# Patient Record
Sex: Male | Born: 1965 | Race: White | Hispanic: No | Smoking: Never smoker
Health system: Southern US, Community
[De-identification: ages and names within clinical notes are randomized; demographics above are authoritative.]

## PROBLEM LIST (undated history)

## (undated) DIAGNOSIS — I10 Essential (primary) hypertension: Secondary | ICD-10-CM

## (undated) DIAGNOSIS — K759 Inflammatory liver disease, unspecified: Secondary | ICD-10-CM

## (undated) DIAGNOSIS — E119 Type 2 diabetes mellitus without complications: Secondary | ICD-10-CM

## (undated) HISTORY — PX: CARDIAC CATHETERIZATION: SHX172

## (undated) HISTORY — PX: LEG AMPUTATION: SHX1105

## (undated) HISTORY — PX: FOOT SURGERY: SHX648

## (undated) HISTORY — PX: BACK SURGERY: SHX140

---

## 1998-05-27 ENCOUNTER — Inpatient Hospital Stay (HOSPITAL_COMMUNITY): Admission: EM | Admit: 1998-05-27 | Discharge: 1998-06-01 | Payer: Self-pay | Admitting: Emergency Medicine

## 1999-04-13 ENCOUNTER — Encounter: Payer: Self-pay | Admitting: Emergency Medicine

## 1999-04-13 ENCOUNTER — Emergency Department (HOSPITAL_COMMUNITY): Admission: EM | Admit: 1999-04-13 | Discharge: 1999-04-13 | Payer: Self-pay | Admitting: Emergency Medicine

## 1999-06-12 ENCOUNTER — Inpatient Hospital Stay (HOSPITAL_COMMUNITY): Admission: EM | Admit: 1999-06-12 | Discharge: 1999-06-14 | Payer: Self-pay | Admitting: Emergency Medicine

## 1999-06-12 ENCOUNTER — Encounter: Payer: Self-pay | Admitting: Internal Medicine

## 1999-07-17 ENCOUNTER — Other Ambulatory Visit (HOSPITAL_COMMUNITY): Admission: RE | Admit: 1999-07-17 | Discharge: 1999-07-21 | Payer: Self-pay | Admitting: Psychiatry

## 2000-11-25 ENCOUNTER — Inpatient Hospital Stay (HOSPITAL_COMMUNITY): Admission: EM | Admit: 2000-11-25 | Discharge: 2000-11-27 | Payer: Self-pay | Admitting: Emergency Medicine

## 2000-12-07 ENCOUNTER — Encounter: Admission: RE | Admit: 2000-12-07 | Discharge: 2000-12-07 | Payer: Self-pay

## 2001-03-18 ENCOUNTER — Emergency Department (HOSPITAL_COMMUNITY): Admission: EM | Admit: 2001-03-18 | Discharge: 2001-03-18 | Payer: Self-pay | Admitting: Emergency Medicine

## 2001-03-18 ENCOUNTER — Encounter: Payer: Self-pay | Admitting: Emergency Medicine

## 2001-03-30 ENCOUNTER — Inpatient Hospital Stay (HOSPITAL_COMMUNITY): Admission: AD | Admit: 2001-03-30 | Discharge: 2001-04-09 | Payer: Self-pay | Admitting: Orthopaedic Surgery

## 2002-08-04 ENCOUNTER — Emergency Department (HOSPITAL_COMMUNITY): Admission: EM | Admit: 2002-08-04 | Discharge: 2002-08-04 | Payer: Self-pay | Admitting: Emergency Medicine

## 2004-01-09 ENCOUNTER — Emergency Department (HOSPITAL_COMMUNITY): Admission: EM | Admit: 2004-01-09 | Discharge: 2004-01-09 | Payer: Self-pay | Admitting: Emergency Medicine

## 2004-05-12 ENCOUNTER — Inpatient Hospital Stay (HOSPITAL_COMMUNITY): Admission: RE | Admit: 2004-05-12 | Discharge: 2004-05-16 | Payer: Self-pay | Admitting: Orthopaedic Surgery

## 2004-06-19 ENCOUNTER — Inpatient Hospital Stay (HOSPITAL_COMMUNITY): Admission: AD | Admit: 2004-06-19 | Discharge: 2004-07-03 | Payer: Self-pay | Admitting: Orthopedic Surgery

## 2004-06-19 ENCOUNTER — Encounter (INDEPENDENT_AMBULATORY_CARE_PROVIDER_SITE_OTHER): Payer: Self-pay | Admitting: Specialist

## 2004-06-23 ENCOUNTER — Encounter (INDEPENDENT_AMBULATORY_CARE_PROVIDER_SITE_OTHER): Payer: Self-pay | Admitting: Cardiology

## 2004-07-03 ENCOUNTER — Encounter (HOSPITAL_BASED_OUTPATIENT_CLINIC_OR_DEPARTMENT_OTHER): Admission: RE | Admit: 2004-07-03 | Discharge: 2004-07-18 | Payer: Self-pay | Admitting: Orthopedic Surgery

## 2004-07-03 ENCOUNTER — Encounter (HOSPITAL_COMMUNITY): Admission: RE | Admit: 2004-07-03 | Discharge: 2004-08-29 | Payer: Self-pay | Admitting: Physician Assistant

## 2004-07-13 ENCOUNTER — Emergency Department (HOSPITAL_COMMUNITY): Admission: EM | Admit: 2004-07-13 | Discharge: 2004-07-13 | Payer: Self-pay | Admitting: *Deleted

## 2004-07-17 ENCOUNTER — Ambulatory Visit (HOSPITAL_COMMUNITY): Admission: RE | Admit: 2004-07-17 | Discharge: 2004-07-17 | Payer: Self-pay | Admitting: Orthopedic Surgery

## 2004-07-21 ENCOUNTER — Emergency Department (HOSPITAL_COMMUNITY): Admission: EM | Admit: 2004-07-21 | Discharge: 2004-07-22 | Payer: Self-pay | Admitting: Emergency Medicine

## 2004-07-23 ENCOUNTER — Inpatient Hospital Stay (HOSPITAL_COMMUNITY): Admission: AD | Admit: 2004-07-23 | Discharge: 2004-07-25 | Payer: Self-pay | Admitting: Internal Medicine

## 2004-07-23 ENCOUNTER — Ambulatory Visit: Payer: Self-pay | Admitting: Infectious Diseases

## 2004-10-13 ENCOUNTER — Ambulatory Visit: Payer: Self-pay | Admitting: Physical Medicine & Rehabilitation

## 2004-10-13 ENCOUNTER — Inpatient Hospital Stay (HOSPITAL_COMMUNITY): Admission: AD | Admit: 2004-10-13 | Discharge: 2004-10-28 | Payer: Self-pay | Admitting: Internal Medicine

## 2004-10-21 ENCOUNTER — Encounter (INDEPENDENT_AMBULATORY_CARE_PROVIDER_SITE_OTHER): Payer: Self-pay | Admitting: *Deleted

## 2005-01-17 IMAGING — US IR US GUIDE VASC ACCESS LEFT
1 series · 3 of 3 positions shown · non-contrast
Comparison: none

CLINICAL DATA: Foot infection requiring IV antibiotics.  Removal of a right upper extremity PICC line due to infection.  Request to place new PICC line within left upper extremity. 
 LEFT UPPER EXTREMITY PICC PLACEMENT WITH ULTRASOUND AND FLUORO GUIDANCE
TECHNIQUE: The left arm was prepped with Betadine, draped in the usual sterile fashion, and infiltrated locally with 1% Lidocaine.  Ultrasound demonstrated patency of the left brachial vein.  Under real-time ultrasound guidance, this vein was accessed with a 21 gauge micropuncture needle.  Ultrasound image documentation was performed.  The needle was exchanged over a guidewire for a Peel-Away sheath through which a 5 French single-lumen PICC catheter trimmed to 46.5 cm was advanced, positioned with its tip at the distal SVC/right atrial junction.  Fluoroscopy during the procedure and fluoro spot radiograph confirms appropriate catheter position.  The catheter was flushed, secured to the skin with Prolene sutures, and covered with a sterile dressing.  No immediate complication. 
 Fluoro time:  0.2 minutes.
 IMPRESSION
 Technically successful left arm PICC placement with ultrasound and fluoroscopic guidance.  Ready for routine use.

[Series 1: sp us guide vasc access*right* · 3 of 3 slices shown]
[im 1/3]
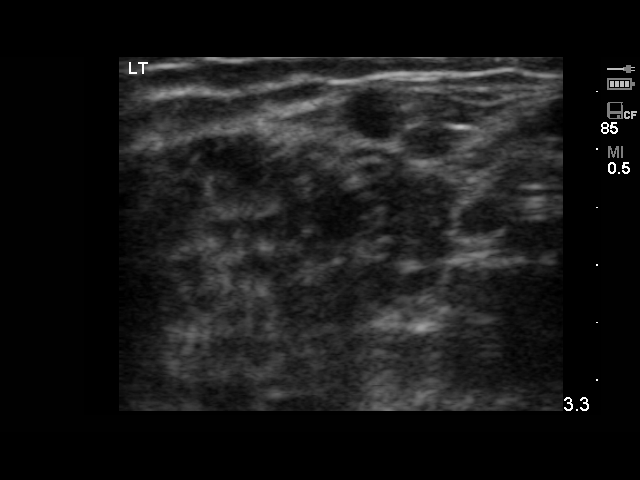
[im 2/3]
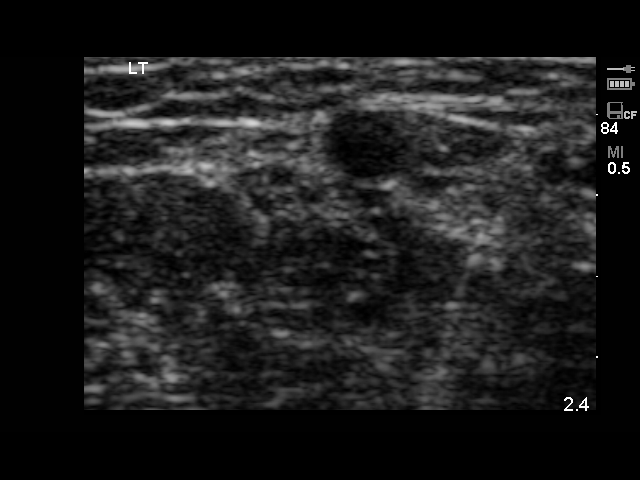
[im 3/3]
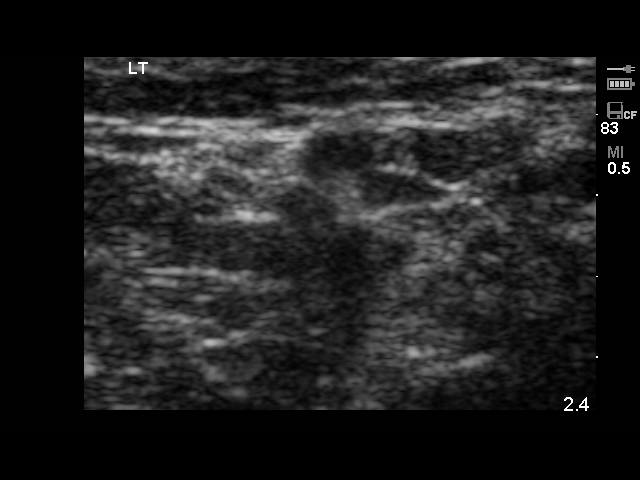

[3 of 3 positions shown; findings below may reference images not displayed]

## 2005-01-24 IMAGING — CT CT CHEST W/ CM
1 series · 15 of 33 positions shown, 19 images · IV contrast ([ID] OMNI 300)
Comparison: none

CLINICAL DATA: MRSA. Septic pulmonary nodules.  Osteomyelitis.  Diabetes. 
CT OF THE CHEST WITH CONTRAST 07/24/04 
Comparing conventional radiograph of 06/30/04 and prior CT scan of the chest 06/23/04.
TECHNIQUE: Contiguous axial CT images were obtained from the lung apices to the bases following intravenous administration of 100 cc of Omnipaque 300 IV contrast.

[Series 2: routine chest · axial · 0.70mm/px · z∈[-339,-74]mm · 15 of 63 slices shown, 19 images]
[im 5/63  mediastinal]
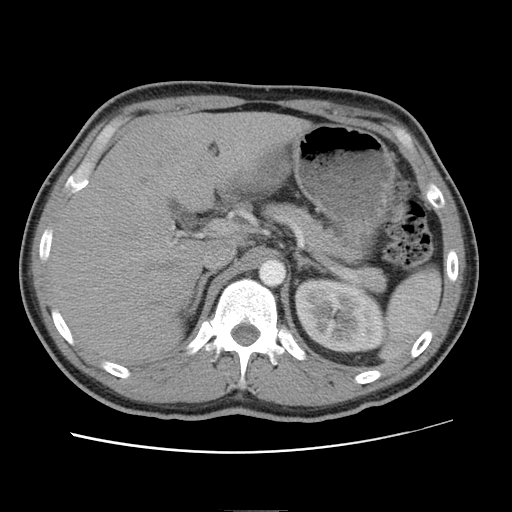
[im 5/63  lung]
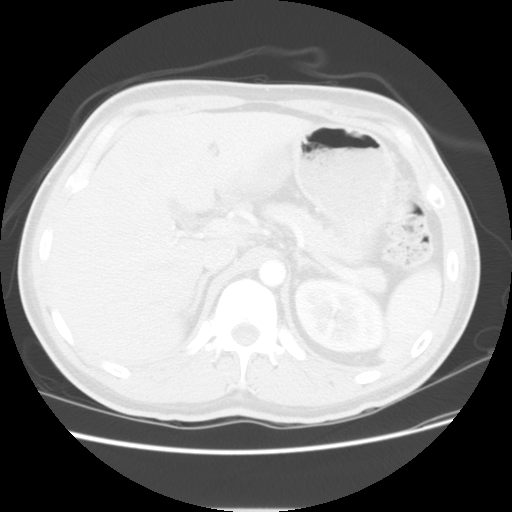
[im 10/63  lung]
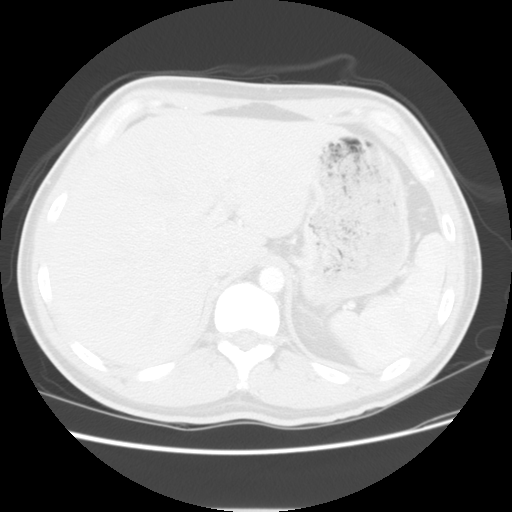
[im 13/63  lung]
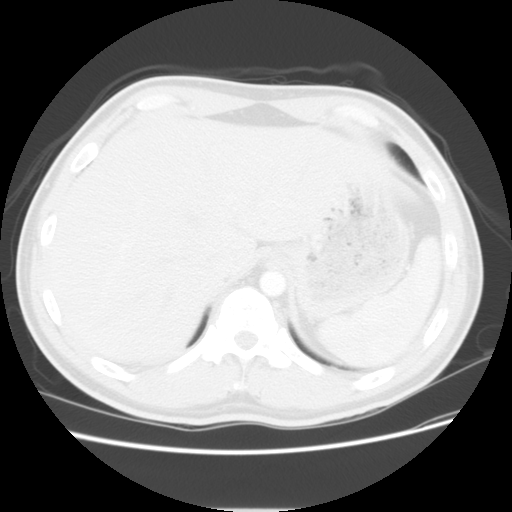
[im 17/63  lung]
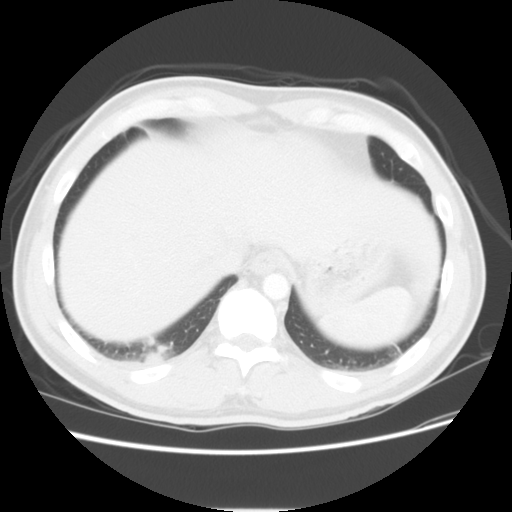
[im 21/63  mediastinal]
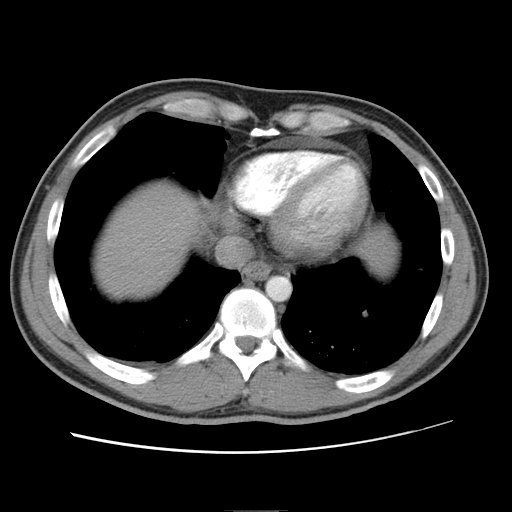
[im 21/63  lung]
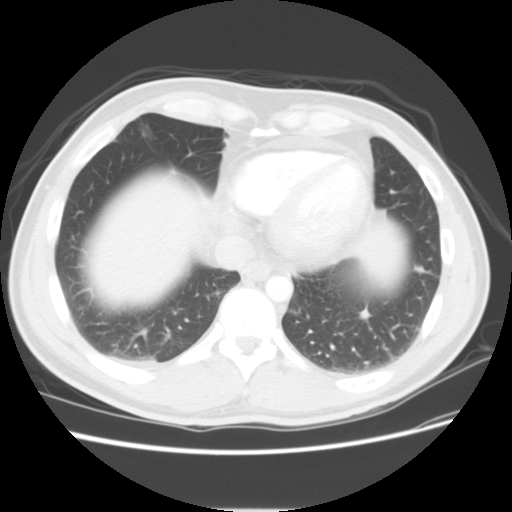
[im 25/63  lung]
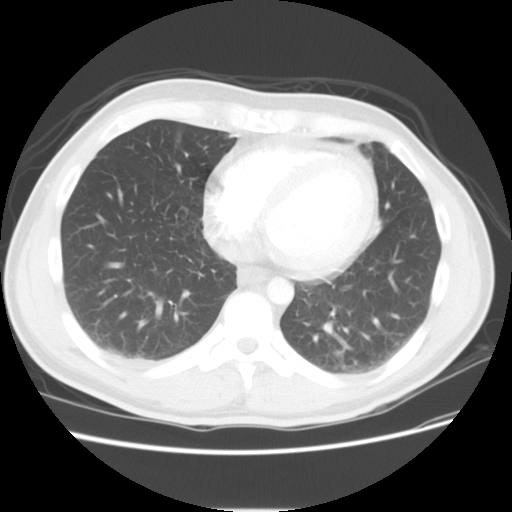
[im 28/63  lung]
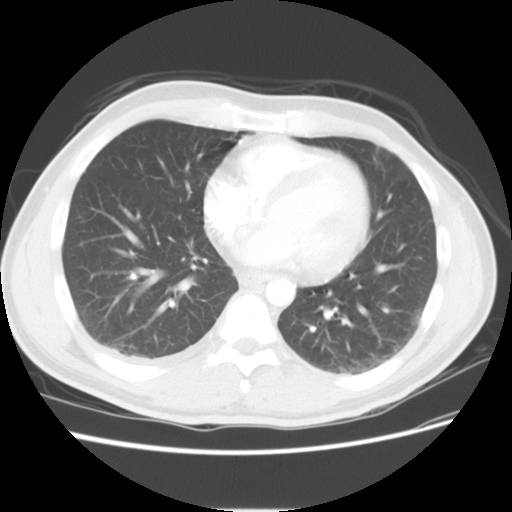
[im 33/63  lung]
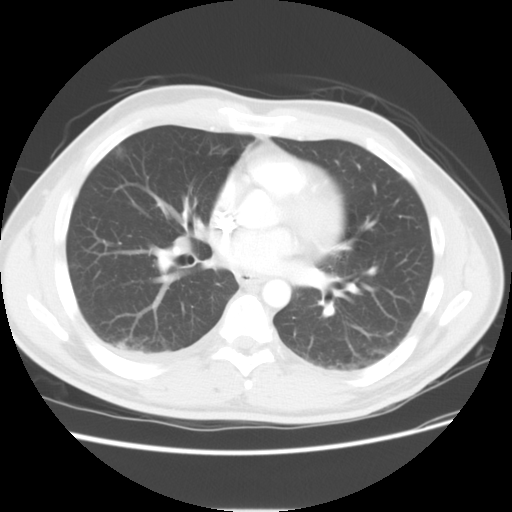
[im 35/63  mediastinal]
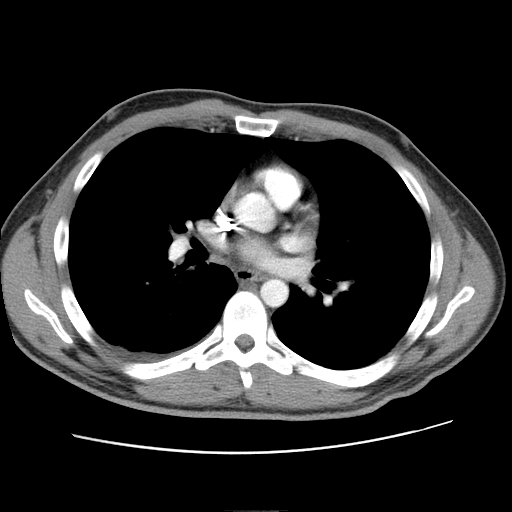
[im 35/63  lung]
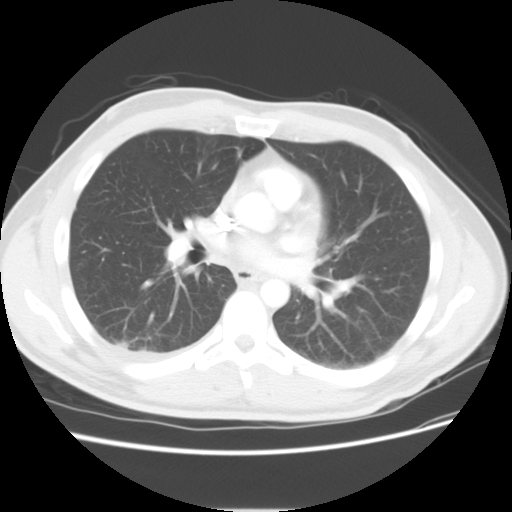
[im 38/63  lung]
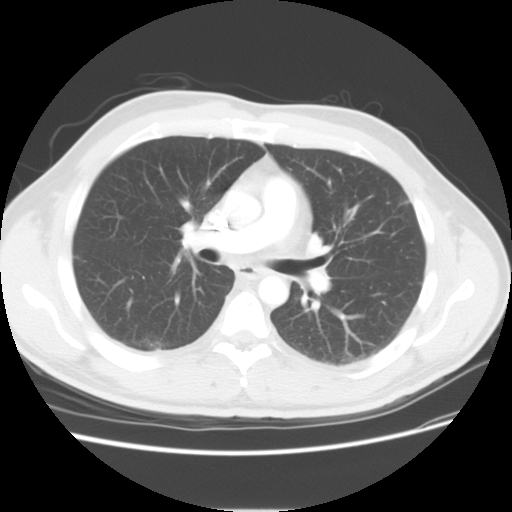
[im 42/63  lung]
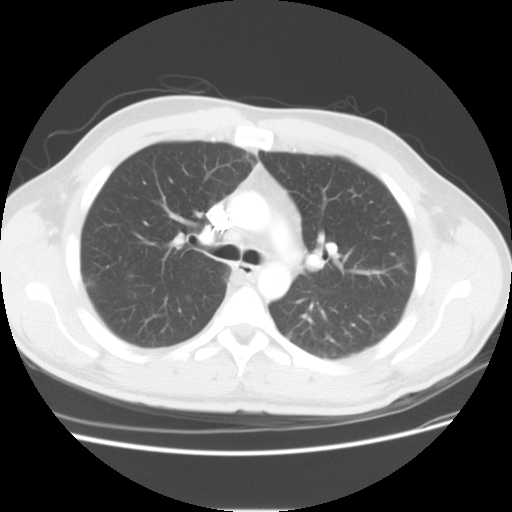
[im 46/63  lung]
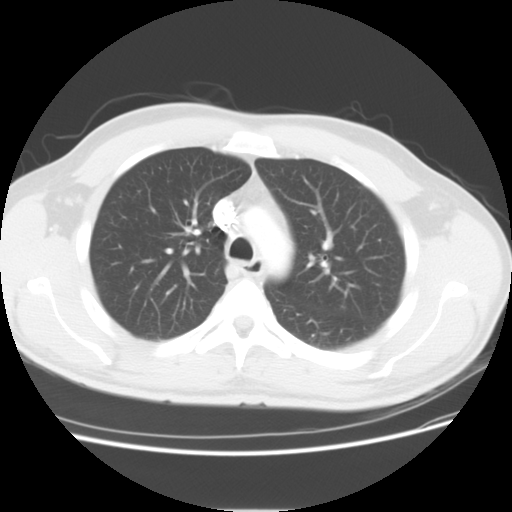
[im 50/63  mediastinal]
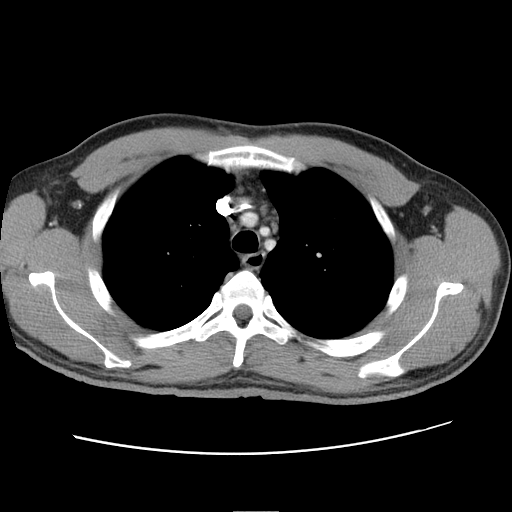
[im 50/63  lung]
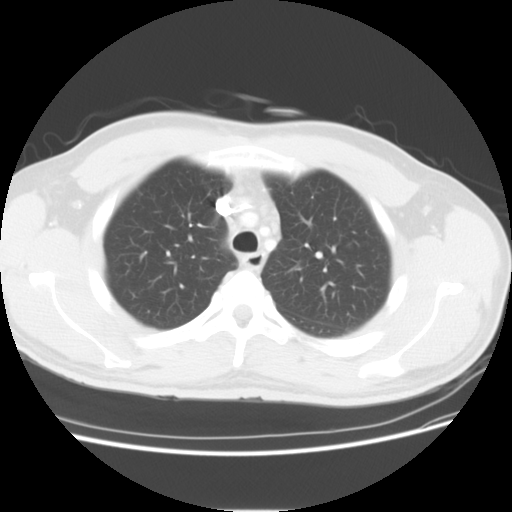
[im 53/63  lung]
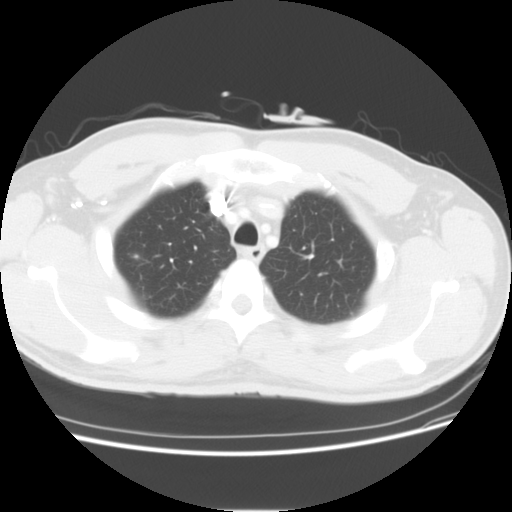
[im 58/63  lung]
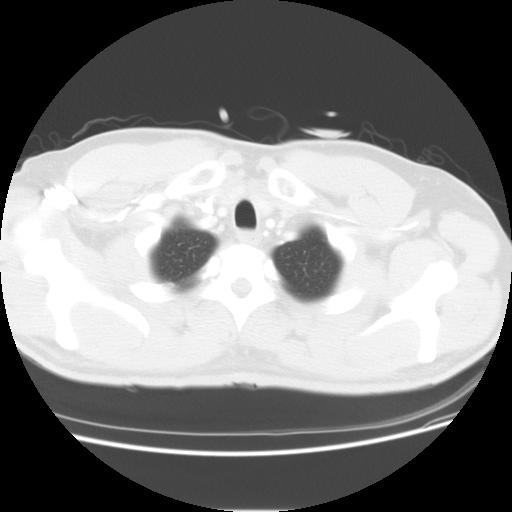

[15 of 33 positions shown; findings below may reference images not displayed]

FINDINGS: There is reduction in size, number and conspicuity of bilateral ill-defined pulmonary nodules.  An index nodule anteriorly in the right upper lobe which previously measured 14 mm in AP dimension currently measures 10 mm in AP dimension and is considerably less dense.  Some other regions of significant nodularity involvement have completely resolved.  Another index nodule at the left lung base which previously measured 16 mm in diameter currently measures 7 mm in diameter.  Approximately 15 small regions of nodular opacity persist.  We do not demonstrate cavitation.  
  There is improvement in the bibasilar atelectasis noted on the prior exam as well as near resolution of the pleural effusions, with only a very small amount of right pleural fluid persisting.  Some small mediastinal lymph nodes are still present.  There is a small anterior pericardial effusion.  
IMPRESSION
1.  Significant improvement in bilateral irregular pulmonary nodules.  Many of these nodules have resolved and all of them appear smaller.  Appearance suggest significant improvement in septic pulmonary emboli.  
2.  Small pericardial effusion.

## 2005-03-08 ENCOUNTER — Inpatient Hospital Stay (HOSPITAL_COMMUNITY): Admission: EM | Admit: 2005-03-08 | Discharge: 2005-03-12 | Payer: Self-pay | Admitting: Emergency Medicine

## 2005-04-06 ENCOUNTER — Encounter: Admission: RE | Admit: 2005-04-06 | Discharge: 2005-06-04 | Payer: Self-pay | Admitting: Orthopaedic Surgery

## 2005-05-05 ENCOUNTER — Inpatient Hospital Stay (HOSPITAL_COMMUNITY): Admission: EM | Admit: 2005-05-05 | Discharge: 2005-05-09 | Payer: Self-pay | Admitting: *Deleted

## 2005-05-05 ENCOUNTER — Ambulatory Visit: Payer: Self-pay | Admitting: Internal Medicine

## 2005-05-13 ENCOUNTER — Ambulatory Visit: Payer: Self-pay | Admitting: Internal Medicine

## 2005-07-03 ENCOUNTER — Inpatient Hospital Stay (HOSPITAL_COMMUNITY): Admission: EM | Admit: 2005-07-03 | Discharge: 2005-07-06 | Payer: Self-pay | Admitting: Emergency Medicine

## 2009-05-27 ENCOUNTER — Encounter: Admission: RE | Admit: 2009-05-27 | Discharge: 2009-05-27 | Payer: Self-pay | Admitting: Interventional Cardiology

## 2009-06-20 ENCOUNTER — Inpatient Hospital Stay (HOSPITAL_BASED_OUTPATIENT_CLINIC_OR_DEPARTMENT_OTHER): Admission: RE | Admit: 2009-06-20 | Discharge: 2009-06-20 | Payer: Self-pay | Admitting: Interventional Cardiology

## 2010-07-12 ENCOUNTER — Observation Stay (HOSPITAL_COMMUNITY): Admission: EM | Admit: 2010-07-12 | Discharge: 2010-07-13 | Payer: Self-pay | Admitting: Emergency Medicine

## 2010-07-13 ENCOUNTER — Inpatient Hospital Stay (HOSPITAL_COMMUNITY): Admission: EM | Admit: 2010-07-13 | Discharge: 2010-07-16 | Payer: Self-pay | Admitting: Emergency Medicine

## 2011-02-12 LAB — COMPREHENSIVE METABOLIC PANEL
ALT: 18 U/L (ref 0–53)
ALT: 20 U/L (ref 0–53)
AST: 23 U/L (ref 0–37)
Albumin: 2.8 g/dL — ABNORMAL LOW (ref 3.5–5.2)
Albumin: 3.1 g/dL — ABNORMAL LOW (ref 3.5–5.2)
Alkaline Phosphatase: 103 U/L (ref 39–117)
CO2: 27 mEq/L (ref 19–32)
Calcium: 8 mg/dL — ABNORMAL LOW (ref 8.4–10.5)
Chloride: 106 mEq/L (ref 96–112)
GFR calc Af Amer: >60 mL/min (ref >60)
GFR calc Af Amer: >60 mL/min (ref >60)
GFR calc non Af Amer: >60 mL/min (ref >60)
Glucose, Bld: 224 mg/dL — ABNORMAL HIGH (ref 70–99)
Potassium: 3.3 mEq/L — ABNORMAL LOW (ref 3.5–5.1)
Sodium: 140 mEq/L (ref 135–145)
Sodium: 142 mEq/L (ref 135–145)
Total Bilirubin: 0.7 mg/dL (ref 0.3–1.2)
Total Protein: 5.5 g/dL — ABNORMAL LOW (ref 6.0–8.3)

## 2011-02-12 LAB — BASIC METABOLIC PANEL
BUN: 10 mg/dL (ref 6–23)
BUN: 11 mg/dL (ref 6–23)
BUN: 2 mg/dL — ABNORMAL LOW (ref 6–23)
BUN: 2 mg/dL — ABNORMAL LOW (ref 6–23)
BUN: 3 mg/dL — ABNORMAL LOW (ref 6–23)
BUN: 4 mg/dL — ABNORMAL LOW (ref 6–23)
CO2: 18 mEq/L — ABNORMAL LOW (ref 19–32)
CO2: 19 mEq/L (ref 19–32)
CO2: 23 mEq/L (ref 19–32)
CO2: 24 mEq/L (ref 19–32)
CO2: 25 mEq/L (ref 19–32)
CO2: 29 mEq/L (ref 19–32)
Calcium: 7.6 mg/dL — ABNORMAL LOW (ref 8.4–10.5)
Calcium: 7.9 mg/dL — ABNORMAL LOW (ref 8.4–10.5)
Calcium: 8 mg/dL — ABNORMAL LOW (ref 8.4–10.5)
Calcium: 8.1 mg/dL — ABNORMAL LOW (ref 8.4–10.5)
Calcium: 8.2 mg/dL — ABNORMAL LOW (ref 8.4–10.5)
Chloride: 102 mEq/L (ref 96–112)
Chloride: 104 mEq/L (ref 96–112)
Chloride: 107 mEq/L (ref 96–112)
Chloride: 108 mEq/L (ref 96–112)
Chloride: 112 mEq/L (ref 96–112)
Chloride: 114 mEq/L — ABNORMAL HIGH (ref 96–112)
Chloride: 114 mEq/L — ABNORMAL HIGH (ref 96–112)
Chloride: 116 mEq/L — ABNORMAL HIGH (ref 96–112)
Creatinine, Ser: 0.66 mg/dL (ref 0.4–1.5)
Creatinine, Ser: 0.72 mg/dL (ref 0.4–1.5)
Creatinine, Ser: 0.79 mg/dL (ref 0.4–1.5)
Creatinine, Ser: 0.82 mg/dL (ref 0.4–1.5)
Creatinine, Ser: 0.84 mg/dL (ref 0.4–1.5)
Creatinine, Ser: 0.97 mg/dL (ref 0.4–1.5)
Creatinine, Ser: 1.25 mg/dL (ref 0.4–1.5)
Creatinine, Ser: 1.47 mg/dL (ref 0.4–1.5)
GFR calc Af Amer: >60 mL/min (ref >60)
GFR calc Af Amer: >60 mL/min (ref >60)
GFR calc Af Amer: >60 mL/min (ref >60)
GFR calc Af Amer: >60 mL/min (ref >60)
GFR calc Af Amer: >60 mL/min (ref >60)
GFR calc Af Amer: >60 mL/min (ref >60)
GFR calc non Af Amer: 51 mL/min — ABNORMAL LOW (ref >60)
GFR calc non Af Amer: 53 mL/min — ABNORMAL LOW (ref >60)
GFR calc non Af Amer: >60 mL/min (ref >60)
GFR calc non Af Amer: >60 mL/min (ref >60)
GFR calc non Af Amer: >60 mL/min (ref >60)
GFR calc non Af Amer: >60 mL/min (ref >60)
Glucose, Bld: 113 mg/dL — ABNORMAL HIGH (ref 70–99)
Glucose, Bld: 128 mg/dL — ABNORMAL HIGH (ref 70–99)
Glucose, Bld: 137 mg/dL — ABNORMAL HIGH (ref 70–99)
Glucose, Bld: 149 mg/dL — ABNORMAL HIGH (ref 70–99)
Glucose, Bld: 209 mg/dL — ABNORMAL HIGH (ref 70–99)
Glucose, Bld: 231 mg/dL — ABNORMAL HIGH (ref 70–99)
Glucose, Bld: 246 mg/dL — ABNORMAL HIGH (ref 70–99)
Potassium: 2.7 mEq/L — CL (ref 3.5–5.1)
Potassium: 3.1 mEq/L — ABNORMAL LOW (ref 3.5–5.1)
Potassium: 3.3 mEq/L — ABNORMAL LOW (ref 3.5–5.1)
Potassium: 3.3 mEq/L — ABNORMAL LOW (ref 3.5–5.1)
Potassium: 3.4 mEq/L — ABNORMAL LOW (ref 3.5–5.1)
Sodium: 138 mEq/L (ref 135–145)
Sodium: 139 mEq/L (ref 135–145)
Sodium: 142 mEq/L (ref 135–145)
Sodium: 142 mEq/L (ref 135–145)
Sodium: 142 mEq/L (ref 135–145)
Sodium: 146 mEq/L — ABNORMAL HIGH (ref 135–145)

## 2011-02-12 LAB — CBC
HCT: 34.2 % — ABNORMAL LOW (ref 39.0–52.0)
HCT: 35.6 % — ABNORMAL LOW (ref 39.0–52.0)
Hemoglobin: 12.7 g/dL — ABNORMAL LOW (ref 13.0–17.0)
MCHC: 35.6 g/dL (ref 30.0–36.0)
MCV: 97.2 fL (ref 78.0–100.0)
Platelets: 180 10*3/uL (ref 150–400)
Platelets: 181 10*3/uL (ref 150–400)
RBC: 3.78 MIL/uL — ABNORMAL LOW (ref 4.22–5.81)
RDW: 12.4 % (ref 11.5–15.5)
RDW: 12.7 % (ref 11.5–15.5)
WBC: 4 10*3/uL (ref 4.0–10.5)
WBC: 6.6 10*3/uL (ref 4.0–10.5)

## 2011-02-12 LAB — GLUCOSE, CAPILLARY
Glucose-Capillary: 113 mg/dL — ABNORMAL HIGH (ref 70–99)
Glucose-Capillary: 118 mg/dL — ABNORMAL HIGH (ref 70–99)
Glucose-Capillary: 119 mg/dL — ABNORMAL HIGH (ref 70–99)
Glucose-Capillary: 121 mg/dL — ABNORMAL HIGH (ref 70–99)
Glucose-Capillary: 132 mg/dL — ABNORMAL HIGH (ref 70–99)
Glucose-Capillary: 140 mg/dL — ABNORMAL HIGH (ref 70–99)
Glucose-Capillary: 148 mg/dL — ABNORMAL HIGH (ref 70–99)
Glucose-Capillary: 149 mg/dL — ABNORMAL HIGH (ref 70–99)
Glucose-Capillary: 161 mg/dL — ABNORMAL HIGH (ref 70–99)
Glucose-Capillary: 188 mg/dL — ABNORMAL HIGH (ref 70–99)
Glucose-Capillary: 194 mg/dL — ABNORMAL HIGH (ref 70–99)
Glucose-Capillary: 212 mg/dL — ABNORMAL HIGH (ref 70–99)
Glucose-Capillary: 223 mg/dL — ABNORMAL HIGH (ref 70–99)
Glucose-Capillary: 247 mg/dL — ABNORMAL HIGH (ref 70–99)

## 2011-02-12 LAB — POTASSIUM: Potassium: 2.9 mEq/L — ABNORMAL LOW (ref 3.5–5.1)

## 2011-02-12 LAB — HEPATIC FUNCTION PANEL
Albumin: 2.9 g/dL — ABNORMAL LOW (ref 3.5–5.2)
Alkaline Phosphatase: 115 U/L (ref 39–117)
Total Bilirubin: 0.8 mg/dL (ref 0.3–1.2)
Total Protein: 5.7 g/dL — ABNORMAL LOW (ref 6.0–8.3)

## 2011-02-13 LAB — COMPREHENSIVE METABOLIC PANEL
ALT: 29 U/L (ref 0–53)
AST: 30 U/L (ref 0–37)
AST: 22 U/L (ref 0–37)
Albumin: 4 g/dL (ref 3.5–5.2)
Alkaline Phosphatase: 149 U/L — ABNORMAL HIGH (ref 39–117)
Alkaline Phosphatase: 159 U/L — ABNORMAL HIGH (ref 39–117)
CO2: 10 mEq/L — ABNORMAL LOW (ref 19–32)
Calcium: 8.7 mg/dL (ref 8.4–10.5)
Chloride: 102 mEq/L (ref 96–112)
Creatinine, Ser: 2.01 mg/dL — ABNORMAL HIGH (ref 0.4–1.5)
GFR calc Af Amer: 52 mL/min — ABNORMAL LOW (ref >60)
GFR calc Af Amer: 44 mL/min — ABNORMAL LOW (ref >60)
GFR calc non Af Amer: 43 mL/min — ABNORMAL LOW (ref >60)
Potassium: 3.8 mEq/L (ref 3.5–5.1)
Potassium: 4.9 mEq/L (ref 3.5–5.1)
Sodium: 139 mEq/L (ref 135–145)
Sodium: 138 mEq/L (ref 135–145)
Total Bilirubin: 1.9 mg/dL — ABNORMAL HIGH (ref 0.3–1.2)
Total Protein: 7.7 g/dL (ref 6.0–8.3)

## 2011-02-13 LAB — CBC
HCT: 45.5 % (ref 39.0–52.0)
HCT: 42.4 % (ref 39.0–52.0)
Hemoglobin: 14.3 g/dL (ref 13.0–17.0)
MCH: 34.3 pg — ABNORMAL HIGH (ref 26.0–34.0)
MCHC: 35.6 g/dL (ref 30.0–36.0)
MCHC: 33.6 g/dL (ref 30.0–36.0)
MCV: 96.8 fL (ref 78.0–100.0)
MCV: 101.9 fL — ABNORMAL HIGH (ref 78.0–100.0)
Platelets: 245 10*3/uL (ref 150–400)
Platelets: 278 10*3/uL (ref 150–400)
RBC: 4.16 MIL/uL — ABNORMAL LOW (ref 4.22–5.81)
RDW: 11.9 % (ref 11.5–15.5)
RDW: 12.6 % (ref 11.5–15.5)
WBC: 14.9 10*3/uL — ABNORMAL HIGH (ref 4.0–10.5)

## 2011-02-13 LAB — GLUCOSE, CAPILLARY
Glucose-Capillary: 258 mg/dL — ABNORMAL HIGH (ref 70–99)
Glucose-Capillary: 192 mg/dL — ABNORMAL HIGH (ref 70–99)
Glucose-Capillary: 223 mg/dL — ABNORMAL HIGH (ref 70–99)
Glucose-Capillary: 273 mg/dL — ABNORMAL HIGH (ref 70–99)
Glucose-Capillary: 360 mg/dL — ABNORMAL HIGH (ref 70–99)
Glucose-Capillary: 398 mg/dL — ABNORMAL HIGH (ref 70–99)
Glucose-Capillary: 484 mg/dL — ABNORMAL HIGH (ref 70–99)
Glucose-Capillary: 551 mg/dL (ref 70–99)

## 2011-02-13 LAB — BASIC METABOLIC PANEL WITH GFR
BUN: 13 mg/dL (ref 6–23)
CO2: 7 meq/L — CL (ref 19–32)
Calcium: 8.1 mg/dL — ABNORMAL LOW (ref 8.4–10.5)
Chloride: 109 meq/L (ref 96–112)
Creatinine, Ser: 2.4 mg/dL — ABNORMAL HIGH (ref 0.4–1.5)
GFR calc non Af Amer: 30 mL/min — ABNORMAL LOW
Glucose, Bld: 497 mg/dL — ABNORMAL HIGH (ref 70–99)
Potassium: 3.7 meq/L (ref 3.5–5.1)
Sodium: 139 meq/L (ref 135–145)

## 2011-02-13 LAB — RAPID URINE DRUG SCREEN, HOSP PERFORMED
Amphetamines: NOT DETECTED
Barbiturates: NOT DETECTED
Benzodiazepines: NOT DETECTED
Cocaine: NOT DETECTED
Opiates: POSITIVE — AB
Tetrahydrocannabinol: NOT DETECTED

## 2011-02-13 LAB — BASIC METABOLIC PANEL
BUN: 11 mg/dL (ref 6–23)
Creatinine, Ser: 1.71 mg/dL — ABNORMAL HIGH (ref 0.4–1.5)
GFR calc non Af Amer: 44 mL/min — ABNORMAL LOW (ref >60)
Glucose, Bld: 160 mg/dL — ABNORMAL HIGH (ref 70–99)
Potassium: 3.4 mEq/L — ABNORMAL LOW (ref 3.5–5.1)

## 2011-02-13 LAB — URINALYSIS, ROUTINE W REFLEX MICROSCOPIC
Bilirubin Urine: NEGATIVE
Glucose, UA: >1000 mg/dL — AB
Glucose, UA: >1000 mg/dL — AB
Ketones, ur: >80 mg/dL — AB
Ketones, ur: 40 mg/dL — AB
Leukocytes, UA: NEGATIVE
Nitrite: NEGATIVE
Protein, ur: 100 mg/dL — AB
Specific Gravity, Urine: 1.026 (ref 1.005–1.030)
Urobilinogen, UA: 0.2 mg/dL (ref 0.0–1.0)
pH: 5 (ref 5.0–8.0)
pH: 5 (ref 5.0–8.0)

## 2011-02-13 LAB — DIFFERENTIAL
Basophils Absolute: 0.1 10*3/uL (ref 0.0–0.1)
Basophils Relative: 0 % (ref 0–1)
Basophils Relative: 1 % (ref 0–1)
Eosinophils Absolute: 0.1 10*3/uL (ref 0.0–0.7)
Eosinophils Absolute: 0 10*3/uL (ref 0.0–0.7)
Eosinophils Relative: 1 % (ref 0–5)
Eosinophils Relative: 0 % (ref 0–5)
Lymphocytes Relative: 18 % (ref 12–46)
Lymphs Abs: 1.1 10*3/uL (ref 0.7–4.0)
Lymphs Abs: 2.7 10*3/uL (ref 0.7–4.0)
Monocytes Absolute: 0.8 10*3/uL (ref 0.1–1.0)
Monocytes Relative: 7 % (ref 3–12)
Monocytes Relative: 5 % (ref 3–12)
Neutro Abs: 11.3 10*3/uL — ABNORMAL HIGH (ref 1.7–7.7)
Neutrophils Relative %: 76 % (ref 43–77)

## 2011-02-13 LAB — URINE MICROSCOPIC-ADD ON

## 2011-02-13 LAB — CK TOTAL AND CKMB (NOT AT ARMC)
CK, MB: 6.2 ng/mL (ref 0.3–4.0)
Relative Index: 4.3 — ABNORMAL HIGH (ref 0.0–2.5)
Total CK: 145 U/L (ref 7–232)

## 2011-02-13 LAB — LACTIC ACID, PLASMA: Lactic Acid, Venous: 1.9 mmol/L (ref 0.5–2.2)

## 2011-02-13 LAB — URINE CULTURE
Colony Count: NO GROWTH
Culture  Setup Time: 201108142122
Culture: NO GROWTH
Special Requests: NEGATIVE

## 2011-02-13 LAB — TROPONIN I

## 2011-02-13 LAB — MRSA PCR SCREENING: MRSA by PCR: NEGATIVE

## 2011-02-13 LAB — ETHANOL

## 2011-02-13 LAB — AMMONIA: Ammonia: 68 umol/L — ABNORMAL HIGH (ref 11–35)

## 2011-02-13 LAB — HEMOGLOBIN A1C: Mean Plasma Glucose: 223 mg/dL — ABNORMAL HIGH (ref <117)

## 2011-03-08 LAB — POCT I-STAT GLUCOSE: Glucose, Bld: 271 mg/dL — ABNORMAL HIGH (ref 70–99)

## 2011-04-14 NOTE — Cardiovascular Report (Signed)
[name], [name] NO.:  [id number]   MEDICAL RECORD NO.:  [id number]          PATIENT TYPE:  OIB   LOCATION:  1962                         FACILITY:  [facility]   PHYSICIAN:  Corky Crafts, MDDATE OF BIRTH:  06-09-1966   DATE OF PROCEDURE:  06/20/2009  DATE OF DISCHARGE:  06/20/2009                            CARDIAC CATHETERIZATION   REFERRING PHYSICIAN:  Windle Guard, MD   PROCEDURES PERFORMED:  1. Left heart catheterization.  2. Left ventriculogram.  3. Coronary angiogram.  4. Abdominal aortogram.   OPERATOR:  Corky Crafts, MD   INDICATIONS:  Abnormal stress test, persistent shortness of breath,  diabetes.   PROCEDURE NARRATIVE:  The risks and benefits of cardiac catheterization  were explained to the patient and informed consent was obtained.  He was  brought to the Cath Lab.  He was prepped and draped in the usual sterile  fashion.  His right groin was infiltrated with 1% lidocaine.  A 4-French  sheath was placed into the right femoral artery using a modified  Seldinger technique.  Left coronary artery angiography was performed  using a JL-4 pigtail catheter.  The catheter was advanced to the vessel  ostium under fluoroscopic guidance.  Digital angiography was performed  in multiple projections using hand injection of contrast.  Right  coronary artery angiography was performed using a 3-DRC catheter in a  similar fashion.  A pigtail catheter was then advanced to the ascending  aorta and across the aortic valve under fluoroscopic guidance.  A power  injection of contrast was performed in the RAO projection to image the  left ventricle.  Catheter was pulled back under continuous hemodynamic  pressure monitoring and withdrawn to the abdominal aorta.  Power  injection of contrast was performed in the AP projection.  The sheath  was removed using manual compression.   FINDINGS:  1. The left main was widely patent.  2. The left circumflex  had a proximal 25% narrowing.  The OM-1 was a      large vessel, which appeared angiographically normal.  The OM-2 was      a small vessel but widely patent.  3. The left anterior descending had mild calcification proximally.      There is mild atherosclerosis in the distal LAD.  The first and      second diagonal vessels were small-to-medium-sized vessels but both      widely patent.  4. The right coronary artery was a large dominant vessel with moderate      atherosclerosis in the mid vessel.  The PDA and PLA were medium-      sized vessels, both of which were widely patent with mild      irregularities.  5. The left ventriculogram showed overall low normal left ventricular      function with an ejection fraction of about 50%.  There appeared to      be a mid inferior wall aneurysmal segment with overall mid-to-basal      inferior akinesis.  6. Abdominal aortogram.  The right kidney has dual arterial supply.  Both renal arteries are widely patent.  The left kidney is supplied      by a large renal artery which is widely patent.  The SMA appears      widely patent.   HEMODYNAMICS:  1. LV pressure 113/9 with an LVEDP of 12 mmHg.  2. Aortic pressure 113/73 with a mean aortic pressure of 92 mmHg.   IMPRESSION:  1. Mild-to-moderate atherosclerosis diffusely.  No hemodynamically      significant lesions.  2. 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      Overall ejection fraction 50%.  3. No abdominal aortic aneurysm.  No renal artery stenosis.  4. No aortic valve gradient.  Normal hemodynamics.   RECOMMENDATIONS:  Continue medical therapy.  The patient will be  discharged later today.      Corky Crafts, MD  Electronically Signed     JSV/MEDQ  D:  06/20/2009  T:  06/21/2009  Job:  [id number]

## 2011-04-17 NOTE — Discharge Summary (Signed)
[name], [name] NO.:  [id number]   MEDICAL RECORD NO.:  [id number]          PATIENT TYPE:  INP   LOCATION:  4702                         FACILITY:  [facility]   PHYSICIAN:  Margaretmary Bayley, M.D.    DATE OF BIRTH:  11/15/1966   DATE OF ADMISSION:  03/08/2005  DATE OF DISCHARGE:  03/12/2005                                 DISCHARGE SUMMARY   DISCHARGE DIAGNOSES:  1.  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  2.  Resolution of ketoacidosis.  3.  Diabetic gastroparesis.  4.  Chronic neurotrophic ulcer.  5.  Depressive disorder.  6.  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   REASON FOR ADMISSION:  [name] is admitted to [facility] for apparently  the fifth time within the last five years with a complaint of nausea,  vomiting, and poorly controlled diabetes.  The patient was brought into the  emergency room where he was found to have a blood sugar in excess of 500  with a pH of 7.27 and admitted for parenteral fluids and treatment of BKA.   PERTINENT PHYSICAL FINDINGS AND LABORATORY DATA:  GENERAL:  Patient is  lethargic, but oriented x3 without any acute distress.  VITAL SIGNS:  Temperature 99.6, pulse 115, respiratory rate 20, blood  pressure 132/73.  He has some mild changes of a fruity smell to his  respirations.  NECK:  Supple.  No adenopathy or thyroid enlargement.  LUNGS:  Clear to percussion, auscultation.  CARDIAC:  He had a regular, rapid rhythm, but no gallops or rubs.  ABDOMEN:  Soft with some mild epigastric tenderness.  No rebound.  Bowel  sounds were present, but diminished.  EXTREMITIES:  He is status post right below-the-knee amputation.  There was  no exudate drainage from the stump area.   His admission pH is 7.24.  A formal arterial blood gas now performed on room  air his pO2 is 104 with a pCO2 of 25 and a pH of 7.27.  A white count of  13,000 with a hematocrit of 48.   Admission electrolytes:  A potassium of 5.3  with a sodium of 131 and a glucose of 537.  A BUN of 27.  His ___________  hemoglobin is 8.5.  Lipid panel:  Cholesterol is 173 with a triglyceride of  116 and his CPK-MB, myoglobin, troponin levels were all normal.   HOSPITAL COURSE:  The patient is admitted from the emergency room with IV  fluid being given to rehydrate him.  He was initially started on a  continuous infusion of insulin using the standard Glucommander protocol with  reestablishment of a blood sugar in the 100-125 mg percent range.  He was  switched over to a fractional schedule using Humalog and Lantus insulin as  the continuous insulin regimen.  He was started back on Reglan at 5 mg  before meals and 10 at bedtime for treatment of the previously documented  gastroparesis and potassium replacement therapy as a result of urinary loss  during his bout  with DKA.  The treatment of his diabetes was fairly  straightforward without any complications.  However, during his  hospitalization his mother related that the patient appeared to be quite  depressed prior to his admission and because of this a psychiatric  evaluation was obtained from Dr. Jeanie Sewer.  The patient was seen and felt  to have some depressive signs, but was not felt to be suicidal.  It was the  feeling of Dr. Jeanie Sewer that the patient's condition could be treated as an  outpatient and that the patient did not need acute psychiatric  hospitalization.  The remainder of his hospital stay was uneventful.  He was  reestablished on a maintenance regimen to be further __________ as an  outpatient.  One of the challenges, however, is that as an outpatient the  patient hardly ever comes in for follow-up visits and is seen almost always  in an emergency situation.  No feedback is obtained regarding his blood  sugars and appropriate adjustments in his insulin regimen.  His mother was  made aware of the shortcomings of this  type of care.  He is subsequently  discharged home in a condition that is much improved.   DISCHARGE MEDICATIONS:  1.  Lisinopril 10 mg daily.  2.  Ecotrin 81 mg per day.  3.  Lantus 30 units each p.m. with supper with a Humalog sliding scale to be      used after meals and not before meals.  4.  Reglan 10 mg before meals and at bedtime.  5.  Protonix 40 mg before supper.  6.  Multivitamin once a day.   He is scheduled to be seen by Dr. Jeanie Sewer in one month.  He is [facility] to  [facility].  He is scheduled to be seen back in my office in  two weeks.  Patient is discharged on a no added salt, 1800-2000 calorie,  carbohydrate modified, low glycemic diet.       PC/MEDQ  D:  06/18/2005  T:  06/18/2005  Job:  [id number]

## 2011-04-17 NOTE — Discharge Summary (Signed)
[name], [name]                ACCOUNT NO.:  [id number]   MEDICAL RECORD NO.:  [id number]          PATIENT TYPE:  INP   LOCATION:  5041                         FACILITY:  [facility]   PHYSICIAN:  Lenard Galloway. Curry, M.D.DATE OF BIRTH:  01/07/1966   DATE OF ADMISSION:  06/19/2004  DATE OF DISCHARGE:  07/03/2004                                 DISCHARGE SUMMARY   ADMITTING DIAGNOSES:  1.  Right foot methicillin-sensitive Staphylococcus aureus osteo/diabetic      foot ulcer.  2.  Uncontrolled type 1 diabetes mellitus.  3.  Pseudo hyponatremia secondary to uncontrolled type 1 diabetes mellitus.  4.  Osteomyelitis right third toe metatarsal head.   DISCHARGE DIAGNOSES:  1.  Status post I&D and third ray amputation right foot.  2.  Methicillin-resistant Staphylococcus aureus right foot.  3.  Septic deep venous thrombosis, pulmonary embolism.  4.  Thrombocytosis.  5.  Type 1 diabetes mellitus.  6.  Left eye conjunctivitis.  7.  Hyponatremia secondary to type 1 diabetes.  8.  Hypokalemia, resolved.   HISTORY OF PRESENT ILLNESS:  [name] is a 45 year old white male who  presented to [name] with a history of problems with right foot in the  past.  Patient has had chronic left foot infections and problems with  infections in other areas of his body prior to admission.  On June 13  patient tripped, stubbed third toe of his right foot on vacuum cleaner.  Initially, did not have any problems; however, had increased pain in the  foot over time.  Then he developed quite a bit of swelling, redness, and  some bleeding with purulent discharge.  Patient was admitted at that time  and had irrigation and debridement of the right foot.  Wound did eventually  grow methicillin-sensitive Staph aureus with Staph viridans.  Patient has  history of previous MRSA infection of the left foot.  He refused PICC line  placement at the time for vancomycin treatment.  He did not continue his  Zyvox  antibiotics due to cost.  He only continued Cipro.  Patient then  presented to [name] office on July 20 with increased temperature  and drainage from the right foot.  X-rays at that time showed third  metatarsal head changes consistent with osteomyelitis with destruction of  the proximal phalanx of the right toe.   ALLERGIES:  No known drug allergies.   MEDICATIONS:  1.  Novolog 70/30 insulin taken several times a day.  2.  Cipro.  3.  Darvocet-N 100 one to two tablets p.o. q.4h. p.r.n. pain.   SURGICAL PROCEDURE:  Patient was taken to the operating room on June 19, 2004 by [name].  Patient was placed under general anesthesia.  I&D  and third ray amputation right foot was performed.  Patient tolerated  procedure well and returned to recovery in good and stable condition.   CONSULTS:  1.  [facility]  2.  Infectious disease  3.  Case management, PT, OT   [facility] COURSE:  Postoperative day one patient febrile, Tmax 102.7,  otherwise vital  signs stable.  Next, postoperative day two patient's Tmax  101.3, vital signs stable.  He developed some rib cage irritation at rest  and with deep breaths.  Also, conjunctivitis left eye.  Patient was also  found to be hyponatremic and hypokalemic.  These were replaced and magnesium  was checked.  Chest x-ray ordered.  Chest x-ray showed diffuse interstitial  pulmonary edema, areas of vague nodularity left mid and lower lung.  Patient  was moved to a stepdown unit.  Blood cultures, UA/culture and sensitivity  were ordered.  Postoperative day four patient without any shortness of  breath.  He had burning sensation in his chest bilaterally.  Therefore, EKG  was rechecked and serial cardiac enzymes were checked.  Chest x-ray was  checked.  It was felt that he had acute CHF.  Doppler was ordered to rule  out DVT.  It was found that patient did have a DVT in the right lower  extremity.  Chest CT was then ordered.  Found that  patient had multiple  septic emboli in bilateral lungs with tiny pericardial and pleural  effusions.  Patient was placed on Lovenox and Coumadin protocol.  Postoperative day five patient's chest discomfort improving.  Patient still  febrile with Tmax 102.6.  Otherwise, vital signs stable.  White blood count  was 12,900.  Blood cultures pending at that time.  Patient was on vancomycin  day six and Zosyn day two at this point.  Postoperative day seven Tmax  100.4, vital signs stable.  White blood count trended up 15,100.  Next,  postoperative day eight patient's Tmax at that point was 98.3.  White blood  count was trending down 14,100.  Right calf pain was improving and patient  had no chest pain or shortness of breath.  Patient's white blood count  continued to trend down over the next several days on postoperative day 11  white blood count 12,700.  Did bump thrombocytosis 1047.  Tmax was 100.1.  It was felt that patient had been seen by [name] for prior  endocrine management and that his thrombocytosis could be managed at  discharge.  Postoperative day 12 patient is afebrile, vital signs stable.  Postoperative day 14 patient was afebrile.  White count had returned to  normal at 8700, platelets still elevated at 1029.  At this point it was felt  by [facility] hospitalists and infectious disease that the patient could be  discharged home and receive antibiotics on an outpatient basis.   LABORATORIES:  Routine laboratories on admission:  CBC:  White blood count  was 14,600, hemoglobin 13.0, hematocrit 37.9, platelets 368.  At time of  discharge white blood cell count was 8700, hemoglobin 7.1, hematocrit 33.1,  platelets 1029.  Coags on admission:  PT 13.1, INR 1.0, PTT 41.  Routine  chemistries on admission:  Sodium 129, potassium 4.8, chloride 94,  bicarbonate 23, glucose 351, BUN 14, creatinine 1.3.  Routine chemistries on June 24, 2004:  Sodium 134, potassium 4.1, chloride 99,  bicarbonate 27,  glucose 171, BUN 9, creatinine 1.0.  Routine chemistries on admission:  AST  24, ALT 28, ALP 133, total bilirubin 1.0.  Magnesium level on June 21, 2004  was 1.9.  Cardiac markers performed on June 22, 2004 all within normal  limits except troponin was slightly elevated at 0.06.  Set of cardiac  enzymes on June 22, 2004 CK, CK-MB within normal limits, troponin 0.08.  CK,  CK-MB within normal limits on third draw,  troponin 0.12.  On urinalysis on  admission showed glucose to be greater than 1000, hemoglobin trace,  bilirubin small, ketones greater than 80, protein 100, bacteria few,  granular casts were noted.  Urinalysis on June 22, 2004 was negative.  Blood  cultures performed on June 21, 2004 showed methicillin-resistant Staph  aureus.  Blood cultures on July 25, no growth after five days.  Wound tissue  cultures right foot performed on June 19, 2004 showed abundant methicillin-  resistant Staph aureus.  Anaerobic cultures right foot showed rare wbc's,  persistent PMN, rare gram-positive cocci in pairs.  No anaerobes isolated.  EKG on admission June 19, 2004 shows sinus tachycardia, heart rate of 101  beats per minute, PRT axis 73, -30, 57.  EKG on June 21, 2004 showed sinus  tachycardia with short PR interval, heart rate 139 beats per minute, PR  interval 106 milliseconds, PRT axis 4138.  EKG performed on July 24 showed  sinus tachycardia, heart rate of 110 beats per minute, PR interval 130  milliseconds, PRT axis 62, -12, 23.  EKG performed on June 24, 2004 showed  sinus tachycardia, heart rate 112 beats per minute, PR interval 124  milliseconds, PRT axis 56, -15, 47.  Echocardiogram performed on June 23, 2004 showed LV size at upper normal limits.  Overall left ventricular  systolic function was lower limits of normal.  Left ventricular ejection  fraction was estimated to be in range of 50-55%.  Two-view chest x-ray on  June 21, 2004 showed congestive heart failure,  diffuse interstitial  pulmonary edema.  Areas of vague nodularity seen within mid and lower lung  zone.  Recommend follow-up chest x-ray.  Chest x-ray performed on June 22, 2004 showed cardiomegaly with increased vascularity, minimal interstitial  changes, improved, subsegmental atelectasis bilaterally, minimal left lower  lobe atelectasis, no pneumothorax, improved interstitial infiltrate,  bilateral subsegmental atelectasis.  Chest CT performed on June 23, 2004  showed too numerous to count pulmonary nodules involving all lobes of the  lungs.  Lesions have irregular borders and peripheral predominance.  Given  patient's young age and lack of history of primary malignancy, septic emboli  are favored differential consideration.  Other infectious processes or even  neoplasm could not be excluded by imaging.  __________ pericardial effusion  with small bilateral pleural effusions seen.  DVT study showed no evidence of lower extremity DVT.  Chest x-ray performed on June 30, 2004 PICC line  placement showed PICC line tip coiled probably in the right subclavian vein.  Chest x-ray June 30, 2004 showed right-sided PICC line extending laterally  down chest wall, unchanged patchy air space disease.  Chest x-ray performed  on July 01, 2004 showed right PICC line tip in SVC.   DISCHARGE INSTRUCTIONS:  Patient was to resume home medications and the  following medications:  doxycycline 100 mg one p.o. b.i.d. to start when  finished with IV antibiotics, Coumadin 5 mg take as instructed by Dr. Jeannetta Nap  (first appointment with Dr. Jeannetta Nap on Friday, July 04, 2004), Vicodin 5/500  mg one q.4h. as needed for pain, Protonix 40 mg one tablet b.i.d.   DIET:  No restrictions.   ACTIVITY:  Weightbearing as tolerated.   WOUND CARE:  Family is to change dressing daily.  Iodoform to center of  wound and wet to [facility]   SPECIAL INSTRUCTIONS:  [facility] for IV  antibiotics 150 mg b.i.d.  Vancomycin level, CBC,  BMET to be drawn weekly.  Hydrotherapy to right foot  wound Monday, Wednesday, Friday at [facility].  The patient is to call [phone] extension [phone] for appointment at 9:30 on Friday, July 04, 2004.  Patient is given the schedule for blood cultures to be done on September 27,  October 11, October 25 at [facility].  PICC line to be pulled by IV team  at [facility] at end of therapy.   FOLLOWUP:  Patient needs follow-up with [name] in 10 days.  Patient  to call office at [phone] for appointment.  Patient needs follow-up with  Dr. Chestine Spore in 7-10 days to monitor his CBGs.  Patient is to follow up with  Dr. Roxan Hockey August 11, 2004 at 10:55.   CONDITION ON DISCHARGE:  Patient was discharged home in improved, stable  condition.       GC/MEDQ  D:  09/24/2004  T:  09/24/2004  Job:  [id number]

## 2011-04-17 NOTE — Op Note (Signed)
NAME:  [name], [name]                          ACCOUNT NO.:  [id number]   MEDICAL RECORD NO.:  [id number]                   PATIENT TYPE:  OIB   LOCATION:  2550                                 FACILITY:  [facility]   PHYSICIAN:  Lenard Galloway. Chaney Malling, M.D.           DATE OF BIRTH:  1966/06/28   DATE OF PROCEDURE:  06/19/2004  DATE OF DISCHARGE:                                 OPERATIVE REPORT   PREOPERATIVE DIAGNOSIS:  Osteomyelitis, proximal phalanx and third  metatarsal, right foot, with abscess formation, Methicillin-resistant Staph  aureus.   POSTOPERATIVE DIAGNOSIS:  Osteomyelitis, proximal phalanx and third  metatarsal, right foot, with abscess formation, Methicillin-resistant Staph  aureus.   PROCEDURE:  Incision, drainage and third ray amputation, right foot.   SURGEON:  Lenard Galloway. Chaney Malling, M.D.   ANESTHESIA:  General.   DESCRIPTION OF PROCEDURE:  The patient was placed on the operative table in  the supine position and with a pneumatic tourniquet about the right upper  thigh, the right lower extremity was prepped with Duraprep and draped out in  the usual manner.  The leg was then draped out with an Esmarch and the  tourniquet was elevated.  An incision was made over the third metatarsal  shaft and carried down about the PIP joint of the third toe.  The skin edges  were retracted.  A great deal of pus was expressed from the wound. Aerobic  and anaerobic cultures were obtained.  There was complete destruction of the  third metatarsal head and the bone was quite soft.  There was also  significant destruction of the proximal phalanx and the proximal phalanx  itself was infected, falling apart and quite soft.  The proximal phalanx was  deleted and the rest of the toe was also involved with this abscess  formation of the tissue planes distal to the PIP joint and some softening of  the middle phalanx.  It was felt there was osteomyelitis involving the  entire toe.  Proximally, the  third metatarsal was cut with a power saw at  the junction of the proximal mid third.  The entire ray was then excised  including the toe.  The tissue planes were opened and pus was removed.  Pulsating lavage was used and irrigated copiously. All tissue planes were  opened very carefully in order for the abscess formation to be seen.  Aerobic and anaerobic cultures were taken and the proximal phalanx was sent  for pathologic evaluation.  The wounds were left wide open.  The wound was  then packed with saline soaked Kerlix and sterile dressings were applied.  The patient was returned to the recovery room in excellent position.  Technically the procedure went well.   COMPLICATIONS:  None.  Rodney A. Chaney Malling, M.D.    RAM/MEDQ  D:  06/19/2004  T:  06/19/2004  Job:  [id number]

## 2011-04-17 NOTE — Op Note (Signed)
NAMEHALO, LASKI NO.:  [id number]   MEDICAL RECORD NO.:  [id number]          PATIENT TYPE:  INP   LOCATION:  5028                         FACILITY:  [facility]   PHYSICIAN:  Claude Manges. Whitfield, M.D.DATE OF BIRTH:  02-Mar-1966   DATE OF PROCEDURE:  10/21/2004  DATE OF DISCHARGE:                                 OPERATIVE REPORT   PREOPERATIVE DIAGNOSIS:  Diabetic peripheral vascular disease and peripheral  neuropathy, right lower extremity, with chronic osteomyelitis, mid foot.   POSTOPERATIVE DIAGNOSIS:  Diabetic peripheral vascular disease and  peripheral neuropathy, right lower extremity, with chronic osteomyelitis,  mid foot.   PROCEDURE:  Right below the knee amputation.   SURGEON:  Claude Manges. Cleophas Dunker, M.D.   ASSISTANT:  Richardean Canal, P.A.-C.   ANESTHESIA:  General orotracheal.   COMPLICATIONS:  None.   DESCRIPTION OF PROCEDURE:  With the patient comfortable on the operating  table and under general orotracheal anesthesia, the right lower extremity  was placed in a thigh tourniquet.  The leg was then prepped with Duraprep  from the ankle to the tourniquet.  A sterile dressing had been applied to  the foot and then covered with an impervious stockinette.  With the  extremity elevated, it was Esmarch exsanguinated with a proximal tourniquet  at 250 mmHg.  The skin incision was outlined with a marking pencil.  The  anterior amputation site was measured approximately 5-6 fingerbreadths  distal to the tibial tubercle.  The posterior flap was several inches distal  to that posteriorly.  Using a 15 blade knife, the skin incision was incised  circumferentially and a small veins were Bovie coagulated.  Anterior,  lateral, and posterior compartment musculature was identified and carefully  incised with the Bovie.  Neurovascular bundles were carefully identified and  clamped with hemostats.  The peritoneal nerve was identified and retracted  and incised so that  it would retract into the musculature.  Small veins were  doubly suture ligated with 0 Vicryl suture.  The tibia was identified  laterally.  The fibula was identified laterally.  The oscillating saw was  used to osteotomize the tibia and the fibula.  The fibula was osteotomized  at a point proximal to the osteotomy of the tibia.  The posterior  musculature was then identified and sized.  Neurovascular bundles were  clamped.  At that point, the large neurovascular bundles had been isolated.  The amputation knife was used to taper the incision through the posterior  skin incision.  The amputation was complete.  The tibial nerve was placed on  traction and then Bovie coagulated and allowed to retract the musculature  and the posterior veins and arteries were carefully doubly suture ligated.  At that point, the tourniquet was deflated, gross bleeders were Bovie  coagulated.  The wound was copiously irrigated with saline solution.  A  myofascial closure was performed with 0 Vicryl with an excellent closure of  the edges, the bones were rasped, the tibia was tapered anteriorly, and a  very nice closure, the subcu was closed with 2-0 Vicryl and the skin was  closed with skin clips.  A sterile, bulky dressing was applied  followed by Webril and splints with the knee in extension.  The final  dressing involved an Ace bandage.  The patient tolerated the procedure  without complications and returned to the post anesthesia recovery room  without complications.      Cindee Lame   PWW/MEDQ  D:  10/21/2004  T:  10/21/2004  Job:  [id number]

## 2011-04-17 NOTE — Discharge Summary (Signed)
NAMEMERREL, CRABBE NO.:  [id number]   MEDICAL RECORD NO.:  [id number]          PATIENT TYPE:  INP   LOCATION:  5028                         FACILITY:  [facility]   PHYSICIAN:  [name], M.D.    DATE OF BIRTH:  03/31/1966   DATE OF ADMISSION:  10/13/2004  DATE OF DISCHARGE:  10/28/2004                                 DISCHARGE SUMMARY   DISCHARGE DIAGNOSES:  1.  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  2.  Ulceration of the right foot with drainage and consistent with a      possible osteomyelitis.  3.  Staphylococcus soft tissue and osteomyelitis infection.   REASON FOR ADMISSION:  This is one of several [place] hospitalizations  for [name], a 44 year old insulin-requiring diabetic, who has been  admitted to the orthopedic service for at least 2 or 3 occasions over the  past 6 months because of a non-healing neurotrophic ulcer and infection of  the right foot.  The patient apparently underwent a transmetatarsal  amputation of the middle toe in July of this year, but subsequently  underwent the development of pulmonary emboli during that admission and no  further surgery was done, with the patient having been anticoagulated and  subsequently discharged.  Despite earlier amputation, he states that Dr.  Claude Manges. Whitfield, who has been following him for several years now, stated  that he would require a probable amputation of the 2nd right toe as well  when there was non-healing and the development of a purulent ulceration of  the plantar aspect of the right foot; in addition, the patient also had some  swelling and pain of the 2nd and 3rd toes on the left; however, there was no  breakdown in this area.  The patient's surgery apparently had been  discontinued because each time he was seen by Dr. Cleophas Dunker or the P.A., his  blood sugars were in excess of 400 and he was subsequently referred  to my  office for tighter diabetic control.   PERTINENT LABORATORIES AND X-RAYS ON ADMISSION:  His admission white count  is normal at 6100, hematocrit of 37; he had a shift to the left.  Sed rate  of 58.  Initial pro time and an INR are normal.  His metabolic panel is  normal with the exception of a potassium of 5.3.  His glucose is 417 with a  calcium of 8.1, albumin of 2.4.  All other parameters of his metabolic panel  except an albumin of 2.4 were perfectly normal.  His glycosylated hemoglobin  level was elevated at 10.2.  Triglycerides of 162, cholesterol of 193 and he  had normal TSH and free T4 levels.  A vancomycin level performed on the 17th  of November, 4 days into his hospitalization, was normal at 9.3.  Cultures  of his foot wound did grow out moderate Staph aureus with methicillin  resistance; the methicillin-resistant Staph, however, was sensitive to  gentamicin, levofloxacin, rifampin and of course, vancomycin.   HOSPITAL COURSE:  The patient  was admitted to the orthopedic floor, where  after appropriate cultures were obtained and he was empirically started on  vancomycin along with doxycycline for presumed methicillin-resistant Staph  aureus infection.  A venous Doppler was performed, in view of the previous  history of DVT and pulmonary embolus, and there was no evidence of DVT  noted.  The lower extremity arterial evaluation revealed normal ABIs in both  legs.  An outpatient MRI scan apparently did show changes consistent with  early osteomyelitis of the 2nd digit.  The patient was seen in the hospital  by Dr. Cleophas Dunker and associates, where local treatment orders were given.  After the use of Lantus and increasing amounts of NovoLog to cover his  preprandial blood sugars, it was felt that the patient's diabetes was under  adequate control and after an appropriate amount of vancomycin and  doxycycline, it was felt that the patient was in optimum condition to go to  the  OR; he was subsequently taken to the OR by Dr. Cleophas Dunker and associates,  where the patient underwent a right BKA.  He tolerated the procedure and  general anesthesia quite well.  Emotionally, the patient appeared to accept  the consequences and the treatment modality recommended.  Postoperatively,  with the use of Percocet and parenteral Dilaudid, he had a fairly uneventful  postoperative course.  He was continued on vancomycin therapy with steady  improvement and healing of his amputation site.   Throughout his hospital course, the patient was treated with prophylactic  Lovenox and postoperatively, Coumadin at 10 mg b.i.d., in view of his  previous history.  There were no other acute problems noted during his  hospital stay.  A PICC line was placed for a more extended course of  parenteral vancomycin as an outpatient.  The patient was set up with  [facility], who will deliver his parenteral antibiotics.   FOLLOWUP:  He was scheduled to be seen back in  Dr. Hoy Register office in 10-  14 days and scheduled to be seen in my office in 2-3 weeks.   DISCHARGE MEDICATIONS:  He was subsequently discharged home on:  1.  Coumadin at 10 mg per day.  2.  A sliding-scale regimen of NovoLog ranging from 6 units through 30 units      before each meal, along with b.i.d. Lantus insulin to be used as a basal      dose of insulin.   DISCHARGE CONDITION:  The patient's discharge condition is significantly  improved.  Prognosis is considered to be fair.   SPECIAL DISCHARGE INSTRUCTIONS:  His dose of vancomycin is scheduled to be  monitored by pharmacy, who will use the standard protocol based on the  patient's weight and renal function.      PC/MEDQ  D:  12/31/2004  T:  01/01/2005  Job:  [id number]

## 2011-04-17 NOTE — Discharge Summary (Signed)
NAME:  [name], [name]                          ACCOUNT NO.:  [id number]   MEDICAL RECORD NO.:  [id number]                   PATIENT TYPE:  INP   LOCATION:  6706                                 FACILITY:  [facility]   PHYSICIAN:  Claude Manges. Cleophas Dunker, M.D.            DATE OF BIRTH:  [birth year]   DATE OF ADMISSION:  05/12/2004  DATE OF DISCHARGE:  05/16/2004                                 DISCHARGE SUMMARY   ADMISSION DIAGNOSES:  1. Non-controlled juvenile diabetes.  2. 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  3. History of left foot osteomyelitis, status post incision and drainage and     amputation of the second toe.  4. Retinal hemorrhage status post laser surgery.   DISCHARGE DIAGNOSES:  1. Right foot methicillin-sensitive Staphylococcus aureus with Streptococcus     viridans.  2. Non-controlled juvenile diabetes.  3. Recurrent abscesses with methicillin-resistant Staphylococcus aureus     positive.   HISTORY OF PRESENT ILLNESS:  The patient is a 45 year old white male with a  history of juvenile diabetes poorly controlled, multiple chronic left foot  infections, and recurrent abscess on the left mouth lip area.  A fortnight  previous to admission, he was walking, tripped, stubbed the third toe of the  right foot on a vacuum cleaner, initially did not have any problems but  gradually had increased pain, swelling, redness, and eventually started  bleeding with purulent discharge.  The patient was seen in Dr. Hoy Register  office and would be admitted for debridement, possible amputation of the  right third toe due to infection.   ALLERGIES:  No known drug allergies.   CURRENT MEDICATIONS:  1. Insulin 70/30 sliding scale.  2. Vicodin p.r.n.  3. Completed doxycycline one week previous.   SURGICAL PROCEDURE:  None.   CONSULTS:  1. Infectious disease consult was requested for evaluation of the wound,     antibiotic choice.  2. Internal medicine consult  was requested for evaluation of the patient's     non-controlled diabetes and management of medical issues during     hospitalization.   HOSPITAL COURSE:  On May 12, 2004, the patient was admitted to [facility] in the care of Dr. Norlene Campbell, M.D. for infected right foot,  third toe.  The patient has history of MRSA infections, multiple chronic  wounds on the foot, and non-controlled diabetes.  The patient was placed on  IV vancomycin, requested medical consult and infectious disease consult for  evaluation of the patient's multiple medical issues.  Over the process of  the patient's four day hospitalization, the patient's insulin was adjusted  per the patient's glycemic state with the patient's glucose being 300 on  admission and being very labile throughout his hospitalization ranging  anywhere from 441 to 49.  Hemoglobin A1c was 10.1.  The patient was  evaluated by infectious disease, cultures were monitored, an MRI was  evaluated showing no abscesses but  the patient did have some cellulitis  without signs of osteomyelitis.  The patient did have some bedside  debridements of necrotic and purulent materials x 2.  His wound gradually  improved over his four day stay as did his pain.  Infectious disease made  recommendations that the patient be placed on IV vancomycin on discharge.  The patient refused the PICC line, so he was discharged on Cipro and Zyvox  600 mg p.o. b.i.d.  The patient, otherwise, remained medically stable  throughout his hospitalization, and he was discharged home in good condition  with continued close followup from the orthopedic standpoint and  recommendations that he follow up with his medical physician.   LABS:  Arterial evaluation found ABIs within normal limits bilateral lower  extremities.   MRI of the right foot, on May 13, 2004, found cellulitis without evidence  of soft tissue abscess or osteomyelitis.   CBC, on May 12, 2004, WBCs 15.9,  hemoglobin 12.7, hematocrit 36.6,  platelets 409.  Routine chemistries, on May 13, 2004, sodium 136, potassium  of 3.9, glucose 185, BUN 10, creatinine 0.9.  Hemoglobin A1c, on admission,  was 10.1.  Vancomycin trough level was 8.2.  Urinalysis, on admission, was  amber, specific gravity of 1.036, greater than 1,000 glucose, 15 ketones.  Blood cultures were no growth five days.  Wound culture showed abundant  staph aureus resistant to penicillin and tetracycline with moderate strep  viridans.  CBGs throughout his hospital stay ranged from 441 to 49.   MEDICATIONS UPON DISCHARGE FROM THE HOSPITAL:  1. Tylenol 650 mg p.o. q.4h. p.r.n.  2. Ambien 5-10 mg p.o. q.h.s. p.r.n.  3. Novolog insulin sliding scale.  4. Vancomycin 1500 mg IV q.12h.  5. Zosyn 4.5 grams q.8h.  6. Humulin 70/30 injection, 20 units subcu __________ .  7. Humulin 70/30 insulin, 30 units subcu every day a.c.  8. Lisinopril 5 mg p.o. every day.  9. Vicodin 1-2 tablets every four to six hours p.r.n.   DISCHARGE INSTRUCTIONS:  1. Medications:     a. Zyvox 600 mg one tablet twice a day for 14 days.     b. Cipro 500 mg one tablet twice a day.  2. No recommendations made by infectious disease or internal medicine upon     discharge.  3. Follow up with Dr. Cleophas Dunker in his office in two weeks from discharge.  4. No other discharge instructions noted in progress notes or on patient     information sheet.   CONDITION ON DISCHARGE:  Stable.      Jamelle Rushing, P.A.                      Claude Manges. Cleophas Dunker, M.D.    RWK/MEDQ  D:  06/11/2004  T:  06/11/2004  Job:  [id number]

## 2011-04-17 NOTE — H&P (Signed)
[name], [name] NO.:  [id number]   MEDICAL RECORD NO.:  [id number]          PATIENT TYPE:  INP   LOCATION:  5028                         FACILITY:  [facility]   PHYSICIAN:  Margaretmary Bayley, M.D.    DATE OF BIRTH:  Aug 08, 1966   DATE OF ADMISSION:  10/13/2004  DATE OF DISCHARGE:  10/28/2004                                HISTORY & PHYSICAL   ADMISSION DIAGNOSES:  1.  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  2.  Type 1 diabetes mellitus.  3.  Diabetic peripheral neuropathy.  4.  Angiopathy.  5.  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  6.  Methicillin-resistant Staphylococcus aureus.  7.  Skin rash most likely related to septicemia.   HISTORY OF PRESENT ILLNESS:  This is one of several [name]  hospitalizations for [name], a 45 year old insulin-dependent diabetic,  complicated by a significant peripheral neuropathy and multiple deformities  and previous surgeries for neurotrophic related infections.  The patient had  been admitted to [facility] on several occasions prior to this  admission where it was documented that he did have methicillin-resistant  Staphylococcus aureus and he had been treated on each of these occasions  with both in-house and outpatient Vancomycin.   In August of this year, he had a venous Doppler that was performed because  of swelling of the legs and it was noted to be positive for DVT and the  patient was started on Coumadin which he took for approximately three  months.  He was followed in the [facility] and apparently  did quite well without any evidence of any pulmonary emboli.  Throughout  this three to four-month period of time, the patient experienced continued  drainage of the right foot and plantar aspect of the second digit with a  drainage being noted, however, his amputation of this toe or  significant  debridement of this wound had been limited by the patient's history of DVT  and the chronic use of Coumadin.  The patient was seen in the office several  days prior to this admission having been referred by Claude Manges. Whitfield,  M.D. because of persistent elevated blood sugars in excess of 400.  He had,  according to his mother, a malodorous drainage which has been occurring for  several months now and the patient has been in and out of [facility] and [facility], where he received parenteral  Vancomycin therapy, but with failure of the drainage to clear.  He is  admitted at this time for probable amputation and for a more intensive  treatment of his diabetes mellitus.   PAST MEDICAL HISTORY:  Remarkable for an earlier amputation of the third toe  of the right foot.  He has had multiple oral surgeries for abscesses and old  teeth.  He did experience a pulmonary embolus in July of 2005.  He had a  left foot ulcer about two years ago that required surgical  intervention and  he has had several laser treatments for diabetic eye disease.  The patient  has no history of any allergies.  He currently lives at home with his  mother.   MEDICATIONS:  1.  Insulin 70/30 Novolin with a sliding scale regimen.  2.  Lantus 15 to 20 units b.i.d.  3.  Doxycycline 100 mg b.i.d.   SOCIAL HISTORY:  He has no history of any chemical abuse.  No cigarette  smoking, alcohol.   REVIEW OF SYSTEMS:  Denies any pleuritic chest pain, shortness of breath.  No hesitancy, incontinence, or hematuria.  No joint pain.   PHYSICAL EXAMINATION:  GENERAL:  He is a well-developed, well-nourished, 32-  year-old Caucasian gentleman without any acute distress.  VITAL SIGNS:  Admission blood pressure 104/71.  He is 5 feet 10 inches tall,  pulse rate 115, and temperature 98, weight 167 pounds.  HEENT:  Positive for microaneurysms, punctate hemorrhages, and previous  scars from earlier  laser therapy.  His sclerae is anicteric and no  conjunctival pallor.  Pharynx is without any exudate.  NECK:  Supple.  No adenopathy, thyromegaly, or jugular venous distention.  Axilla is without any adenopathy.  CHEST:  No splinting or tenderness.  Lungs are clear to auscultation and  percussion.  HEART:  His precordium is 2+ dynamic.  S1 and S2 are well-heard.  No  murmurs, rubs, gallops, heaves, or thrills.  ABDOMEN:  Soft and nontender.  No organomegaly and no masses.  GENITOURINARY:  He has a few shotty inguinal nodes.  No penile discharge.  EXTREMITIES:  He has on the right foot in the plantar aspect, a 5 x 10 mm  ulceration with purulent drainage.  His dorsalis pedis and posterior  tibialis pulses bilaterally are trace to 1+.  There is no evidence of any  acute ischemic changes. His calves reveal no focal tenderness.  Negative  Homan's sign.  BACK:  Without any CVA or punch spinal tenderness.  NEUROLOGY:  He is alert, oriented, and cooperative.  He does have a  decreased in pinprick, vibration, and two-point discrimination in a closed  stocking distribution distally.   LABORATORY DATA:  Admission hematocrit is 36 with a white count of 6100 with  shift to the left, sed rate of 58.  INR is 0.9, PT 12.7.  Metabolic panel;  admission potassium of 5.3, chloride 134, glucose 413, with CO2 30, calcium  8.1, and albumin 2.5.  No other abnormalities.  His glycated hemoglobin is  10.2.  Normal TSH and free T4.  His wound grew out a moderate number of  Staph aureus which proved to be moderately methicillin-resistant.   EKG; normal sinus rhythm.  He has small R waves in 3 and aVF and 2 as well  consistent with a possible old inferior infarct, no acute ST T wave changes  noted.   Chest x-ray was perfectly normal.   In summary, [name] is a 45 year old insulin-dependent diabetic with significant diabetic retinopathy and peripheral neuropathy, who is admitted  with a chronically  purulent draining neurotrophic ulcer of the plantar  aspect of the second toe on the right foot and with previous MRI changes  consistent with osteomyelitis.  The patient is admitted for better diabetic  control, appropriate antibiotic therapy, and prophylaxis against the  possibility of DVT and pulmonary emboli which he has had in the past and  then subsequently, he is to have an amputation of that third toe or a below  knee amputation  if necessary.      PC/MEDQ  D:  12/31/2004  T:  12/31/2004  Job:  [id number]

## 2011-04-17 NOTE — Consult Note (Signed)
[place]  Patient:    [name], [name]                       MRN: [id number] Proc. Date: 03/30/01 Adm. Date:  [id number] Attending:  Randolm Idol                          Consultation Report  DATE OF BIRTH:  1966-03-13.  ADMITTING PHYSICIAN:  Claude Manges. Cleophas Dunker, M.D.  REASON FOR CONSULTATION:  Uncontrolled type I diabetes.  HISTORY OF PRESENT ILLNESS:  [name] is a pleasant 45 year old male who presents to [facility] on direct admission to [facility] service with the diagnosis of bilateral lower extremity diabetic infections rule out osteomyelitis.  [name] troubles began approximately 3 weeks ago when he noticed that a large callous on the ball of his left foot had become very soft with a dark black center.  Initially he reported a recently timed injury to the right great toe, questionably, related to a nail stuck in his work boot with bleeding and discharge as well.  He originally presented to Dr. Jeannetta Nap, his primary care physician who sent him to the emergency room where on March 18, 2000, he was placed on tequin and had x-rays that ruled out osteomyelitis.  Subsequent follow up has been wit Dr. Cleophas Dunker who has continued tequin on an outpatient basis.  The patient presented again to Dr. Cleophas Dunker for a follow up appointment today and it was felt that the foot was not improving significantly and that intravenous antibiotics were most appropriate.  Consideration is being given to surgical intervention to assist in clearing of the infection.  The hospital service was consulted given the patients poorly controlled diabetes and the importance of control of blood sugars to allow appropriate wound healing.  PAST MEDICAL HISTORY: 1. Uncontrolled type 1 diabetes.    a. Multiple prior admissions with DKA including March and June of 1999,       July of 2000, and December 2001.    b. History consistent with  diabetic neuropathy.    c. Poor insight into disease process and control. 2. 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 3. Prior history of alcohol and polysubstance abuse with drugs of choice being    crack cocaine - currently denies drug or alcohol use. 4. Tobacco abuse with ongoing chewing tobacco on a q. day basis with ongoing    use of chewing tobacco on a q. day basis. 5. Questionable history of hypertension. 6. Questionable history of depression.  OUTPATIENT MEDICATIONS: 1. 70/30 insulin 30 units q.a.m. and 25 units q.h.s. 2. tequin since March 18, 2001 at 400 mg p.o. q. day. 3. Darvocet p.r.n. pain.  ALLERGIES:  No known drug allergies.  FAMILY HISTORY:  Noncontributory.  SOCIAL HISTORY:  The patient lives in [place].  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  He lives alone.  He is divorced.  He has one son who is healthy.  He has a 12th grade education.  He denies alcohol or ongoing drug use but does admit to using chewing tobacco q. day.  REVIEW OF SYSTEMS:  Review of systems is positive for bilateral lower extremity burning and tingling feeling consistent  with diabetic neuropathy to mid shin level though this is not constant.  Additionally the patient reports fevers and chills at nights over the last 2 weeks.  The patient is having ongoing pain about wounds of both feet which is reasonably well controlled with darvocet.  Full review of systems is otherwise entirely negative.  PHYSICAL EXAMINATION:  VITAL SIGNS:  Blood pressure 99.8, BP 125/79, heart rate 108, respiratory rate 20.  Height 5 feet 9 inches.  GENERAL:  Well appearing 45 year old Caucasian male lying in bed who appears stated age in no acute respiratory or cardiac distress.  HEENT:  Normocephalic and atraumatic.  Pupils, equal, round,  reactive to light and accommodation.  Extraocular muscles intact bilaterally.  Hearing grossly intact bilaterally.  OC/OP clear.  NECK:  No lymphadenopathy or thyromegaly.  CARDIOVASCULAR:  Mildly tachycardic at a 110 beats per minute but regular without appreciable murmur, gallop or rub.  LUNGS:  Clear to auscultation bilaterally without wheezes or rhonchi.  ABDOMEN:  Thin, nontender, nondistended, soft, bowel sounds present.  No hepatosplenomegaly.  No rebound.  EXTREMITIES:  Bilateral feet bandaged.  I elected not to remove the bandages as the patient has recently undergone evaluation and treatment by Dr. Cleophas Dunker earlier today.  Bandages are dry without apparent discharge.  There are palpable 1+ dorsalis pedis pulses, bilateral feet.  There is no appreciable erythema or edema of the shins of the bilateral lower extremities.  NEUROLOGIC:  Cranial nerves II-XII intact bilaterally.  5/5 strength throughout bilateral upper and lower extremities.  Alert and oriented x 4.  ADMISSION DATA:  Lab results from March 18, 2000:  Hemoglobin 12.4, MCV 94, platelets 394, ANC 5.4, white count 8.2, sodium 140, potassium 4.0, chloride 100, CO2 32, BUN 14, creatinine 0.7.  Glucose 409, blood cultures two out of two negative from March 18, 2001.  RECOMMENDATIONS: 1. Uncontrolled type diabetes type 1 - after discussion with [name] it appears that he has very limited insight as to the seriousness of his illness. We will pursue education on diabetes and appropriate treatment during this hospitalization.  For now he will be placed on his home regimen of 70/30 insulin and CBGs will be following closely.  A insulin sliding scale will be added to this with insulin resistant dosing and CBGs will be followed closely. Aggressive tight control of CBGs will be the goal to maximize wound healing. Should the patient require surgical intervention he will be placed on a ______ protocol and insulin drip to  assure very tight control of blood sugars in the immediate perioperative period.  Furthermore I will take advantage of this time to investigate long term  diabetic issues to include evaluation of hemoglobin A1c as well as UA for protein with consideration to initiation of an ACE inhibitor as a prophylactic measure.  We will consider adjusting his home regimen of 70/30 insulin or changing to an alternative insulin form prior to his discharge based upon success and maintaining CBGs within a reasonable range during his hospitalization.  2. Normocytic anemia:  The patient had a CBC on March 18, 2001, in the [facility] Emergency Room which revealed a significant anemia with a hemoglobin of 12.4, and an MCV of 94.  I will recheck his CBC in the morning to reassess the patients hemoglobin status.  If he continues to be anemic then a full work-up will be accomplished to include guaiacs as well as iron studies and ferritin. It is likely that this represents anemia of chronic disease/inflammation related  to the smoldering infection within his feet.  There is no sign of overt bleeding at this time and the patient is hemodynamically stable.  3. Tobacco abuse:  The patient admits to using chewing tobacco on a regular basis.  I counseled the patient extensively as to the ongoing risk of chewing tobacco abuse.  I explained to him that these risks included potential for esophageal and oral cancer.  I also explained to the patient that most chewing tobacco contains a high amount of sugar and use of chewing tobacco would likely aggravate his diabetes and make him more difficult to control.  He voiced understanding and received encouragement to discontinue by his mother who was present at the time of our discussion.  4. Bilateral lower extremity diabetic foot wounds with questionable osteomyelitis.  Dr. Cleophas Dunker with the primary service along with the help of infectious disease will guide the therapy  for these wounds. DD:  03/30/01 TD:  03/31/01 Job: [id number]

## 2011-04-17 NOTE — Consult Note (Signed)
NAME:  [name], [name]                          ACCOUNT NO.:  [id number]   MEDICAL RECORD NO.:  [id number]                   PATIENT TYPE:  INP   LOCATION:  6706                                 FACILITY:  [facility]   PHYSICIAN:  Rosanne Sack, M.D.         DATE OF BIRTH:  Apr 01, 1966   DATE OF CONSULTATION:  05/12/2004  DATE OF DISCHARGE:                                   CONSULTATION   PROBLEM LIST:  1. Diabetes mellitus, type 1.  2. 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   HISTORY OF PRESENT ILLNESS:  [name] is a 45 year old white male with  diabetes mellitus type 1 on 70/30 insulin who has chronic foot infections.  He was admitted under Dr .Cleophas Dunker for diabetic foot infection after  stubbing the toe two days previously.  [facility] has been  consulted for control of diabetes mellitus.  His sugars have been in the  300s for the past few days.  He states they were in the 200s prior to  infection.  He feels that 70/30 works well for him and has tried Lantus in  the past without success.   PAST MEDICAL HISTORY:  1. Diabetes mellitus, type 1.  2. Chronic foot ulcers.   PAST SURGICAL HISTORY:  1. Left foot open reduction and left hip open reduction as an infant.  2. Left foot open reduction in 2003.  3. Left thigh abscess I&D in February 2005.   MEDICATIONS:  1. 70/30 insulin, sliding scale only.  2. Tylenol p.r.n.  3. Aspirin p.r.n.  4. Doxycycline 100 b.i.d. for cellulitis.   ALLERGIES:  No known drug allergies.   SOCIAL HISTORY:  He is divorced and lives alone.  He has one son.  No  tobacco, alcohol, or drugs.  He does have supportive family in the area  including mother and step father.   FAMILY HISTORY:  Noncontributory.   OBJECTIVE:  VITAL SIGNS:  Temperature 99.0, heart rate 120, blood pressure  130/84, respirations 18.  CBG was 302, saturating 95% on room air.  GENERAL: Alert and  oriented, in no acute distress.  HEENT:  Pupils reactive to light.  Extraocular muscles are intact.  Oropharynx is clear with moist mucous membranes.  NECK:  No lymphadenopathy, no JVD, no thyromegaly.  CARDIOVASCULAR:  Regular rate and rhythm.  No murmurs, rubs, or gallops, +2  dorsalis pedis and posterior tibial pulses on the right, 1+ dorsalis pedis  and posterior tibial pulses on the left.  LUNGS:  Clear to auscultation bilaterally with good air movement.  No  wheezes, rhonchi, or crackles.  ABDOMEN: Soft, nontender, nondistended.  No hepatosplenomegaly.  RECTAL:  Exam deferred.  NEUROLOGIC:  Nonfocal.  He moves all four extremities equally.  EXTREMITIES:  Left foot shows congenital deformation of the digits.  Right  foot shows healed ulcers on the plantar aspect, and the third toe is  erythematous with the erythema tracking into his forefoot, and the toe is  edematous.   LABORATORY AND X-RAY DATA:  Sodium 132, potassium 3.9, chloride 95, bicarb  24, BUN 7, creatinine 1.0, glucose 303, calcium 8.4.  Alkaline phosphatase  131, albumin 2.5, AST 11, ALT 10, bilirubin 1.4.  White blood cell count  15.9, hemoglobin 12.7, platelets 409, MCV 92.3, absolute neutrophil count  13.2, 83% segmented neutrophils, 9% lymphocytes.  Blood cultures x 2 are  pending.   IMPRESSION AND PLAN:  1. Diabetes.  He is insulin dependent.  I am uncertain as to why he is on a     70/30 sliding scale insulin only regimen.  He has tried Lantus in the     past and does not seem enthusiastic about it.  Therefore will start his     scheduled 70/30 with a fast-acting regular insulin sliding scale.  He was     taking 50 units of insulin as a part of his sliding scale regimen, but he     cannot recall his sliding scale.  Will start on 30 units in the morning     and 20 units at night with a sliding scale coverage.  We will start him     on Lisinopril as well.  2. Hypoalbuminemia.  This is likely secondary to  malnutrition.  Will check     __________.  3. Foot ulcers.  This is per the primary team and infectious disease.     Maurice March, M.D.                        Rosanne Sack, M.D.    LC/MEDQ  D:  05/12/2004  T:  05/12/2004  Job:  [id number]   cc:   Dr. Melissa Noon, M.D.  [address], [address]  [place]  [place] [address]  Fax: [phone]

## 2011-04-17 NOTE — Discharge Summary (Signed)
[name], [name]                ACCOUNT NO.:  [id number]   MEDICAL RECORD NO.:  [id number]          PATIENT TYPE:  INP   LOCATION:  6732                         FACILITY:  [facility]   PHYSICIAN:  [name], M.D.DATE OF BIRTH:  [birth year]   DATE OF ADMISSION:  05/05/2005  DATE OF DISCHARGE:  05/09/2005                                 DISCHARGE SUMMARY   DISCHARGE DIAGNOSES:  1.  Diabetes mellitus type 1 complicated with diabetic ketoacidosis.  2.  Folliculitis.  3.  Gastritis.   DISCHARGE DIET:  Diabetic diet.   FOLLOWUP:  Dr. __________ on May 13, 2005 at 2:15 p.m. at the outpatient  clinic at [facility].   DISCHARGE MEDICATIONS:  1.  Protonix 40 mg p.o. once daily.  2.  Maalox 30 mL p.o. p.r.n.  3.  Sliding scale insulin.  4.  NovoLog as prior to admission.  5.  Lantus 30 units subcutaneously q.h.s.  6.  Lisinopril 5 mg p.o. once daily.  7.  Doxycycline 100 mg p.o. b.i.d. for 8 more days.  8.  Reglan 5 mg p.o. t.i.d. p.r.n.  9.  Aspirin 81 mg p.o. once daily.   HISTORY OF PRESENT ILLNESS:  [name] is a 53 year old man with a 28 year  history of insulin-dependent diabetes mellitus who was in his usual state of  health on Thursday, June 5 when he last used crack cocaine. On Friday, he  noted CBGs in the range of 300. He began feeling nauseated with multiple  episodes of clear emesis beginning Saturday. Since Saturday a.m., he has  been unable to keep anything down. He started having abdominal pain in the  epigastric region but drinking fluid. He denied any diarrhea, chest pain,  palpitations, shortness of breath, cough, dysuria. The patient last took  insulin on Friday, May 01, 2005.   ALLERGIES:  NONE KNOWN.   PAST MEDICAL HISTORY:  1.  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  2.  Osteomyelitis.  3.  DVT.  4.  PE.   ADMISSION MEDICATIONS:  1.  Humalog.  2.  Sliding scale insulin.  3.  Lantus 75 to 40 units q.h.s.   SOCIAL HISTORY:  He has never smoked. He uses cocaine. He is divorced and  disabled x2 years. He has Medicare. He has family support, mother lives  nearby.   ADMISSION PHYSICAL EXAMINATION:  VITAL SIGNS:  Heart rate 123, blood  pressure 145/84, temperature 97, respiratory rate 24, O2 saturation 99% on  room air.  GENERAL:  No acute distress.  HEENT:  Eyes PERRLA, extraocular movements intact. ENT:  Slightly dry  mucosa, fair dentition.  NECK:  No thyromegaly. Several lesions on the neck, red with dry scar on  top, nonfluctuant, hard with a diameter of 1.5 x 1.5 cm.  RESPIRATORY:  Tachypneic and clear.  CARDIOVASCULAR:  Tachycardic and regular. No murmurs, no rubs, no gallops.  GI:  Decreased bowel sounds, soft, tender over epigastric area.  EXTREMITIES:  No edema, warm, right foot amputated above the ankle.  GU:  Deferred.  SKIN:  Lesions  described at neck, pustules on the right side of the neck.  MUSCULOSKELETAL:  Intact.  NEUROLOGIC:  Nonfocal.  MENTAL STATUS:  Depressed and tearful.   ADMISSION LABORATORY DATA:  Sodium 135, potassium 5.8, chloride 94, bicarb  13, BUN 22, creatinine 2.0, and glucose 512. Hemoglobin 15.5, white blood  cells 17.3, and platelets 478. Anion gap of 28.   HOSPITAL COURSE:  Problem 1. Diabetic ketoacidosis. Anion gap of 28, glucose  of 512. The patient was admitted to our step-down unit. IV insulin was  started at 6 units per hour. We checked BMET and CBGs in a cycle of q.1h.  Blood cultures x2 were consecutively negative. Cardiac enzymes were  negative. Amylase and lipase within normal limits. Chest x-ray did not show  any acute distress. In approximately 12 to 13 hours, anion gap was closed  and the patient was transitioned from IV insulin to NPH insulin and sliding  scale insulin.   Problem 2. Leukocytosis was considered most likely secondary to diabetic  ketoacidosis. Blood cultures were negative. Urine culture was negative.  Chest x-ray no  changes.   Problem 3. Kidney infection, folliculitis versus cellulitis of the neck. The  patient was treated with doxycycline and sent home on 8 more days of  antibiotics.   Problem 4. Polysubstance abuse. We consulted [name] from psychiatry  for further assessment and plan. He recommended ADSL for the patient and he  did not consider the patient needs any medication or further treatment for  depression.   Problem 5. Acute renal failure. Creatinine at admission was 2. It improved  to normal during hospitalization. It was considered secondary to  dehydration. Urine creatinine to microalbumin ratio was slightly increased  so the patient was started on lisinopril.   Problem 6. Prophylaxis. The patient was treated with Protonix and Lovenox.   DISCHARGE LABORATORY DATA:  Sodium 138, potassium 3.8, chloride 100, bicarb  32, BUN 8, creatinine 0.8, glucose 140. Anion gap of 6.       DA/MEDQ  D:  05/09/2005  T:  05/09/2005  Job:  [id number]

## 2011-04-17 NOTE — Discharge Summary (Signed)
NAME:  [name], [name]                          ACCOUNT NO.:  [id number]   MEDICAL RECORD NO.:  [id number]                   PATIENT TYPE:  INP   LOCATION:  5036                                 FACILITY:  [facility]   PHYSICIAN:  Artist Beach, MD                     DATE OF BIRTH:  02-14-1966   DATE OF ADMISSION:  07/23/2004  DATE OF DISCHARGE:  07/25/2004                                 DISCHARGE SUMMARY   MEDICINES ON DISCHARGE:  1. Novolog insulin 30/70 combination, 30 units in the morning, 30 units in     the evening.  2. Doxycycline 100 mg b.i.d. for ten days.  3. Coumadin, the regimen is 5 first day, 5 second day, 2.5 third day to be     continued as per Dr. Jeannetta Nap.  4. Darvocet 100/650, 1-2 tablets every six hours as required.   PROBLEM LIST:  1. Type 1 diabetes mellitus.  2. History of chronic foot infection.  3. DVT postop, July 2005, with septic emboli.  4. Bilateral foot surgeries.  5. History of left hip surgery for bone graft.  6. Laser eye surgery.  7. I&D of left thigh abscess in 2005.  8. History of DKA, twice in the hospital, 5-10 years ago.   CONSULTS:  1. Rockey Situ. Roxan Hockey, M.D. for ID.  2. Claude Manges. Cleophas Dunker, M.D., orthopedic surgeon.  3. The doctor on team is Dr. Margarito Liner.   PROCEDURE:  No procedures were done on the patient.   HISTORY AND PHYSICAL:  This is a 45 year old Caucasian man with a past  medical history of significant for right foot wound, post incision and  drainage, on June 19, 2004, with MRSA positive cultures.  The patient was in  the hospital for two weeks and then was discharged with IV vancomycin b.i.d.  The patient continued this regimen at [facility] and then went  into [facility] for four days.  Due to inadequate treatment at the  [facility], the patient decided to leave AMA, and the patient was  readmitted to be given IV vancomycin.  The patient denies any constitutional  symptoms.  The right leg is  occasionally mildly painful, swelling and  drainage have decreased.  Seen by orthopedic doctor who said that he was  recovering very well.  In the hospital, he was seen by Dr. Roxan Hockey who advised to switch IV  vancomycin to doxycycline 100 mg b.i.d. to be continued for ten days.   PERTINENT LABS ON DISCHARGE:  Glucose was 182, the rest of the B-MET panel  was normal.  PTT was 57, PT 19.1, and INR 1.9.  WBC 7.8, hemoglobin 11.8.  HBA1c is 7.7.   ASSESSMENT AND COURSE IN THE HOSPITAL:  The patient was recovering very  well, was seen by his orthopod who said the patient had minimal serous  discharge from the wound,  was filling up with red granulation tissue and was  recovering very well.  To be followed by orthopod when patient discharged  from the hospital.  Dr. Roxan Hockey advised to switch him to doxycycline oral  for ten days to be followed up in the clinic after the antibiotics were  over.   1. The patient has a followup with Dr. Roxan Hockey on August 11, 2004 at     10:15 a.m.  2. The patient is to follow up with Dr. Jeannetta Nap for his Coumadin regimen.     His INR is 1.9.   Diabetes mellitus.  The patient's discharge glucose is 118.  The patient is  on 30 units of Novolog 70/30 b.i.d. to continue Novolog, to measure glucose  at home.  If sugar above 200, to go back onto previous regimen of 33 units  b.i.d.                                                Artist Beach, MD    SP/MEDQ  D:  07/25/2004  T:  07/26/2004  Job:  [id number]   cc:   Buren Kos, M.D.  [address] [address]  [place], [place] [address]  Fax: [phone]   Chestine Spore, Dr.   Alford Highland. Rankin, M.D.  [address]  [place]  [place] [address]  Fax: [phone]   Lenard Galloway. Chaney Malling, M.D.  [address] [place]  [place] [address]  Fax: [phone]   Ileana Roup, M.D.  [address] [place] [address]  Fax: [phone]   Rockey Situ. Flavia Shipper., M.D.  [address]  [place]  [place] [address]  Fax:  [phone]

## 2011-04-17 NOTE — Op Note (Signed)
[facility]  Patient:    [name], [name]                       MRN: [id number] Proc. Date: 04/05/01 Adm. 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  OPERATION PERFORMED: 1. Extensive irrigation and debridement of abscess and necrotic tissue inner    aspect of left foot. 2. Resection of first metatarsal head at base of proximal phalanx and    sesamoids, great toe secondary to osteomyelitis.  SURGEON:  Claude Manges. Cleophas Dunker, M.D.  ANESTHESIA:  General orotracheal.  COMPLICATIONS:  None.  DESCRIPTION OF PROCEDURE:  With the patient comfortable on the operating table and under general orotracheal anesthesia, the left lower extremity was prepped with Betadine scrub and Betadine solution from the tips of the toes to knee. Sterile draping was performed.  The patient has had evidence of gross purulence exuding from an ulcer over the first metatarsal head.  The patient has had a chronic extension performed of the great toe for years and basically has a cock-up great toe with the great toe dorsiflexed approximately 75 degrees in relationship to the first metatarsal head.  There is an area of skin necrosis between the first and second metatarsal, tarsal heads and gross purulence exuding from both the ulcer as well as an incision and drainage site on the plantar aspect of the foot.  I used an Sports administrator to exsanguinate the foot and placed it about the ankle protecting the posterior neurovascular bundles with 4 x 4 gauze.  I extended the previous longitudinal incision and drainage site on the plantar aspect of his foot proximally and distally approximately an inch to an inch and a half such that distally, I incorporated the ulcer area.  There  was gross purulence and necrotic tissue extending from the heel to the great toe involving predominantly the plantar fascia.  This was all grossly necrotic and abscess material was grossly debrided.  The abscess tracked proximally behind the posterior malleolus and the subcutaneous tissue and this was completely debrided as well as necrotic tissue.  There was definite end point where I could not express any further purulence nor could I see any grossly necrotic tissue.  There were a few pockets of abscess material deep to the plantar fascia which I opened and decompressed.  There was gross purulence exiting from the first metatarsophalangeal joint as well as between the first and second metatarsals.  The articular cartilage in the first metatarsal head was loose and there was gross osteomyelitis of the first metatarsal head.  The bone was soft and there was purulent material. I resected the first metatarsal, went back to what I felt was normal bone. There was also loose articular cartilage in the base of the proximal phalanx of the great toe and accordingly a portion of the proximal phalanx base was excised back to what I thought was normal bone.  I also debrided the abscess between the first and second metatarsal.  There was an area of skin necrosis between the first and second metatarsal heads which I also debrided.  I then irrigated the wound.  I did not see any further gross necrotic  tissue but felt that the foot was semiviable because of the extensive nature of the abscess formation.  I closed a portion of the wound along the plantar aspect of the foot leaving about 2 inches of open wound distally.  I packed the wound with saline soaked Kerlix gauze.  The tourniquet was removed from about the ankle. I had excellent capillary refill to the lateral four toes.  The appearance of the great toe was about the same and I thought it was semiviable even preoperatively.  A sterile bulky  dressing was applied.  The patient tolerated the procedure without complications. DD:  04/05/01 TD:  04/05/01 Job: [id number]

## 2011-04-17 NOTE — H&P (Signed)
[name], [name] NO.:  [id number]   MEDICAL RECORD NO.:  [id number]          PATIENT TYPE:  EMS   LOCATION:  MAJO                         FACILITY:  [facility]   PHYSICIAN:  Michaelyn Barter, M.D. DATE OF BIRTH:  [birth year]   DATE OF ADMISSION:  07/03/2005  DATE OF DISCHARGE:                                HISTORY & PHYSICAL   CHIEF COMPLAINT:  Nausea, vomiting.   HISTORY OF PRESENT ILLNESS:  [name] is a 45 year old male with multiple  past medical admissions for diabetic ketoacidosis with the most recent  admission being from May 05, 2005 to May 09, 2005.  Today he states that he  started having nausea and emesis earlier this week.  He said he suspected  his sugars were high because he had experienced similar symptoms before in  the past.  He has not been able to keep any liquids down.  He denies having  any fevers or chills.  He said he had been taking his insulin but was not  sure of the dosage to actually take secondary to running out of his strips.  He had a single episode of diarrhea on Tuesday this past week but denies  having any urinary related symptoms.  Again, he states that he has had very  similar symptoms before in the past.   PAST MEDICAL HISTORY:  1.  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  2.  Diabetic ketoacidosis with multiple prior admissions.  3.  A nonhealing ulcer of the right foot, later requiring amputation which      was completed November of 2005.  4.  Osteomyelitis of the right middle toe.  5.  DVT of the right calf.  6.  PE.   PAST SURGICAL HISTORY:  1.  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  2.  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  During      this time bone was taken from his hip and transplanted into his left      foot.   ALLERGIES:  Allergies:  No known drug allergies.   HOME MEDICATIONS:  These were taken from discharge summary completed May 09, 2005:  1.   Lisinopril 10 mg once a day.  2.  Ecotrin 81 mg once a day.  3.  Lantus insulin 30 units in the evening plus sliding scale insulin.  4.  Reglan 10 mg before meals and at bedtime.  5.  Protonix.  6.  Multivitamin.   SOCIAL HISTORY:  The patient confesses to smoking crack cocaine and states  that the last time that he smoked crack cocaine was approximately three days  ago and states that he uses it approximately every other day.  Likewise, the  patient confesses to a history of alcohol consumption and states that he  drinks 40 ounces of beer every day.  Cigarettes:  He denies.   REVIEW OF SYSTEMS:  As per HPI.  Otherwise all other systems are negative.   PHYSICAL EXAMINATION:  The patient is a very ill appearing gentleman.  He  is  tachypneic and he is also cooperative.  His vitals at the time of admission:  His temperature was 97.6, heart rate 106, respirations 30, O2 sat 100%,  blood pressure 139/41, several hours later it was 158/43.  HEENT:  Anicteric.  Extraocular movements are intact.  Pupils are equally  reactive to light.  Oral mucosa is dry, no thrush, no exudate.  Neck is  supple, no lymphadenopathy.  Thyroid is not palpable.  CARDIAC:  S1, S2 is present, tachycardic, no murmurs or rubs.  RESPIRATORY:  No crackles, no wheezes.  ABDOMEN:  Soft, nontender, nondistended, positive bowel sounds times four  quadrants.  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  The wound site looks clean, no  skin break down or other signs of infection, no discharge.  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  NEUROLOGIC:  The patient is alert and oriented times three.  Cranial nerves  2 through 12 are intact.  MUSCULOSKELETAL:  5/5 upper and lower extremity strength.   LABORATORY DATA:  ABG:  PH 7.122, PCO2 21.4, bicarb is 7.0.  Hemoglobin  16.7, hematocrit 49.0, sodium 119, potassium 6.1, chloride 90, BUN 91,  creatinine 6.5, glucose greater than 700.   ASSESSMENT AND PLAN:  [name] is a 45 year old gentleman with multiple  prior admissions into the hospital secondary to nausea, vomiting stemming  from diabetic ketoacidosis.  He is today with very similar symptoms:  1.  Diabetic ketoacidosis.  The precipitating factor is most likely to be a      combination of poor medication compliance in the setting of drug abuse      and alcohol abuse.  Will admit the patient to the MICU.  Will continue      Glucometer which has been initiated in the emergency room along with      insulin drip and will aggressively hydrate the patient and consider the      addition of bicarb.  2.  Renal failure.  This is most likely acute.  I looked at the patient's      prior hospital discharge dictation that was completed earlier in the      year and his creatinine at one time appeared to be within the normal      limits, therefore, his chart renal failure is most likely to be acute      and secondary to being prerenal in nature and the etiology is most      likely to be significant dehydration secondary to the intractable emesis      that the patient is experiencing.  Again, will aggressively hydrate the      patient and will monitor the BUN and creatinine for now.  3.  Hyperkalemia.  A question of whether or not the patient actually is      hyperkalemic.  Will repeat the patient's potassium level and if      elevated, will treat.  4.  Hyponatremia.  This is most likely secondary to the hyperglycemia.  This      should improve with control of the patient's sugars.  Will monitor for      now.  5.  Cocaine abuse.  Will monitor for any signs of withdrawal although this      will be unlikely in the setting of cocaine.  6.  Alcohol abuse.  Will monitor for any signs of withdrawal and will treat      on a p.r.n. basis.  7.  Intractable nausea and emesis.  Will provide Phenergan IV p.r.n.  8.  Dehydration.  Will aggressively hydrate the patient. 9.  GI prophylaxis.  Will provide Protonix 40 mg  IV on p.r.n. basis.       OR/MEDQ  D:  07/03/2005  T:  07/03/2005  Job:  [id number]

## 2011-04-17 NOTE — Discharge Summary (Signed)
[name]. [name]  Patient:    [name], [name]                       MRN: [id number] Adm. Date:  [id number] Disc. Date: [id number] Attending:  Randolm Curry Dictator:   [name], P.A. CC:         [name], M.D.   Discharge Summary  ADMISSION DIAGNOSES: 1. Left foot cellulitis. 2. Left foot osteomyelitis. 3. Uncontrolled diabetes mellitus. 4. History of alcohol abuse. 5. History of drug abuse. 6. 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 7. History of hypertension. 8. History of depression.  DISCHARGE DIAGNOSES: 1. Left foot cellulitis. 2. Left foot osteomyelitis. 3. Uncontrolled diabetes mellitus. 4. History of alcohol abuse. 5. History of drug abuse. 6. 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 7. History of hypertension. 8. History of depression.  SURGICAL PROCEDURE:  On Apr 05, 2001, [name] underwent an irrigation and debridement with resection of the first metatarsal head, base of the proximal phalanx, and the sesamoids by [name]. [place].  COMPLICATIONS:  None.  CONSULTATIONS: 1. Infectious diseases consult, Mar 30, 2001. 2. Internal medicine consult by [name], Mar 30, 2001. 3. Pharmacy consult for Lovenox therapy, Apr 01, 2001. 4. Diabetes coordinator consult, Apr 01, 2001.  HISTORY OF PRESENT ILLNESS:  This 45 year old white male patient presented to [name] with a three-week history of a swollen, red, and painful right foot.  He works as a Nutritional therapist and he reports he removed his work shoes and noticed an ulcer with peeling skin at the base of the right great toe on the plantar aspect of the foot.  He initially went to his private medical doctor and was sent to the emergency department locally for evaluation.  He was then told to follow up with an orthopedist on April 24.  He was seen by [name] at that time and placed on Tequin, and then on follow-up appointment, on May  1, was noted to have an open weeping ulcer on the medial and plantar aspect of the foot with skin sloughing off on the anterior aspect of the great toe.  He is admitted at this time for IV antibiotics, infectious diseases consult, and hospitalist evaluation for diabetes management.  HOSPITAL COURSE:  [name] was admitted to 5000, and infectious diseases consult was obtained immediately.  He was placed on Zosyn IV.  His wound was monitored.  [name] hospitalist saw him at that time and he does have a history of uncontrolled diabetes with multiple prior admissions due to DKA. He was started on a diabetic diet, insulin regimen, and frequent CBGs to monitor his blood sugar.  Both infectious diseases and internal medicine followed him throughout his hospitalization.  On Mar 31, 2001, T-max was 102.4, pulse 102, vital signs otherwise stable.  His CBGs ranged from 144 to 360.  X-rays had showed probable osteomyelitis in both sesamoids, at the first metatarsal heads, and probably the second metatarsal head.  It was recommended to keep him on IV antibiotics until the soft tissues stabilized, and then he would have surgical debridement.  He was continued on Zosyn.  On Apr 01, 2001, CBGs still remained highly elevated from 144 to 325. Hemoglobin A1c was 11.5%.  His insulin was adjusted and he was added on Glucophage.  An insulin drip was also started at that time.  He was complaining of some calf pain, so a lower extremity venous  Doppler was obtained on May 2, and this showed no evidence of lower extremity DVT, superficial thrombus, or Bakers cyst, but enlargement of inguinal lymph nodes on the left.  He was continued on b.i.d. dressing changes.  He continued to make slow progress over the next several days.  Cultures were sent for Gram stain from the foot.  Blood cultures were obtained.  These were all negative.  His insulin was adjusted accordingly.  The patient did refuse Lovenox on May 4,  and so he was switched to low dose aspirin therapy.  He continued to have active drainage from that first metatarsal area and this was monitored closely.  On Apr 04, 2001, his soft tissue looked well enough to tolerate surgery and it was planned for the next day.  He still had purulence from the superficial I&D site that was done on the floor.  He was still on insulin drip at that time. His sugars were becoming more well-controlled.  Blood cultures were all negative and outpatient cultures from the foot showed methicillin-sensitive Staphylococcus aureus and an inpatient culture showed Enterococcus growing from the foot wound.  He was continued on Zosyn.  On Apr 05, 2001, [name] tolerated his surgical procedure well.  He was transferred to 5000 postoperatively.  It was noted during the surgery that the abscess tracked all along the plantar aspect of the foot and posterior to the medial malleolus with extensive soft tissue necrosis.  There was obvious osteomyelitis of the first metatarsal head and base, and proximal phalanx.  It was questionable whether this foot would be viable even with this I&D. Infectious diseases continued to follow him, and he was continued on IV Zosyn.  On May 8, postoperative day #1, he was afebrile and vital signs were stable. A foot wound was monitored closely.  He was weaned off insulin drip on May 7. His blood sugars seemed to become a little more controlled.  On Apr 07, 2001, T-max was 99.4, left foot incision was well approximated.  There was some bloody discharge, but no purulence noted.  Generalized erythema about the plantar aspect of the foot, but the wound appeared to be healing.  He was continued on IV antibiotics.  At that time, his blood sugars were much better controlled, with CBGs ranging from 69 to 338.  A sliding scale insulin was discontinued at that time and his a.m. dose of 70/30 was decreased.  He continued to make good progress over the next  several days.  His CBGs  remained low.  His wound continued to improve.  He did have mild normal saline wet-to-dry dressings to the wound edges and that was to be arranged as an outpatient.  It was believed he would be ready for discharge home in the next several days.  Dr. Orvan Falconer also believed it was okay to switch him from Zosyn to Augmentin 875 twice a day on discharge.  Medicine also believes his diabetes was stable enough that he could be discharged home.  On Apr 09, 2001, he was ready for discharge home and was discharged home later that day.  DISCHARGE INSTRUCTIONS:  He is to resume his pre-hospitalization diabetic diet.  DISCHARGE MEDICATIONS: 1. Percocet 5 mg 1-2 tablets p.o. q.4-6h. p.r.n. for pain. 2. Glucophage 500 mg 1 tablet p.o. b.i.d. 3. Augmentin 875 mg 1 tablet p.o. b.i.d. 4. Insulin 70/30 at 35 units subcutaneously q.a.m. and 25 units    subcutaneously q.p.m. 5. Enteric coated aspirin 325 mg 1 tablet p.o. q.d.  6. Lotensin 10 mg 1 tablet p.o. q.d.  ACTIVITY:  He is to be nonweightbearing on the left leg and is to keep it elevated as much as possible.  WOUND CARE:  He is to have daily normal saline wet-to-dry dressing changes done by the home health nurse to the left foot.  FOLLOW-UP:  He is to follow up with [name] this week.  He is to follow up with Dr. Orvan Falconer in the next several weeks.  He is to follow up with Dr. Barbee Shropshire, Friday, May 17, at 2:15 in the afternoon.  The patient stated good understanding of these instructions and was subsequently discharged home.  LABORATORY DATA:  Lower extremity venous Doppler, Mar 31, 2001, showed no evidence of lower extremity DVT, thrombus, or Bakers cyst, but enlargement of the inguinal lymph nodes on the left.  On Mar 30, 2001, white count 16.7, hemoglobin 12, hematocrit 34.8, platelets 609.  May 3, white count 14.1, hemoglobin 10.9, hematocrit 32, platelets 685. On May 4, white count was 13.3, hemoglobin  10.2, hematocrit 30, platelets 699. On May 7, white count 13.5, hemoglobin 11, hematocrit 32.6, platelets 981. May 8, white count 15.6, hemoglobin 10.6, hematocrit 30.4, and platelets 933.  On May 1, sodium 131, potassium 4.3, chloride 91, glucose 461, albumin 2.4. On May 3, glucose 206, BUN 5, creatinine 0.7.  On May 7, glucose 247.  On May 1, alkaline phosphatase 150.  On Mar 30, 2001, hemoglobin A1c was 11.5%.  There was no growth after five days of blood cultures taken on Mar 30, 2001.  Gram stain from the right foot abscess on May 3, showed abundant white blood cells, moderate gram-positive rods.  Culture taken on May 3, from the right foot showed moderate gram-positive cocci, described as Enterococcus species which was sensitive to both vancomycin and ampicillin.  All other laboratory studies were within normal limits. DD:  05/16/01 TD:  05/16/01 Job: [id number]

## 2011-04-17 NOTE — Discharge Summary (Signed)
[place]  Patient:    [name], [name]                       MRN: [id number] Adm. Date:  [id number] Disc. Date: [id number] Attending:  Farley Ly Dictator:   Susie Cassette, M.D. CC:         Dr. Lillia Mountain, Endocrinologist, [place], [place]   Discharge Summary  DISCHARGE DIAGNOSES: 1. Diabetic ketoacidosis, resolved. 2. Diabetes mellitus type 1. 3. Bronchitis. 4. Acute renal failure, resolved.  DISCHARGE MEDICATIONS: 1. Insulin 70/30 30 units q.a.m., 25 units q.h.s. 2. Zithromax 500 mg one p.o. q.d. to complete a five-day course.  FOLLOW-UP:  Mr. Arth was instructed in the [facility] on January 8, at 3 p.m. and was also instructed to bring his blood sugar log.  PROCEDURE:  None.  CONSULTING PHYSICIANS:  None.  HISTORY OF PRESENT ILLNESS:  This is a 45 year old with a history of DKA who reports "a bad cold" x 1-1/2 weeks with a nonproductive cough, a bad sinus headache, purulent rhinorrhea, and occasional diarrhea.  Mr. Economou reported that he began to have abdominal pain over the past 24 hours and has had vomiting x 6 over the past three hours, no hematemesis.  Mr. Yun also reports fever and chills over the past 24 hours.  He denies shortness of breath, rash, dysuria, but does report shoulder pain over the last 24 hours. He does state that he has not missed any insulin, but ran out of testing strips about two weeks ago.  PAST MEDICAL HISTORY: 1. History of DKA. 2. Diabetes mellitus type 1 x 20 years. 3. History of polysubstance abuse. 4. History of left foot deformity, status post surgery.  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  His mother does live nearby.  He received a 12th grade education and does chew tobacco, but denies smoking and alcohol use.  He has had a history of both alcohol and cocaine, but denies any recent use.  FAMILY HISTORY:  Mother is alive  at the age of 71 with palpitations and a negative catheterization as well as kidney and bladder problems.  Father is alive at the age of 32 without any problems as well as a brother who is 64 and healthy.  Maternal grandmother was positive for diabetes.  MEDICATIONS: 1. Insulin 70/30 36 units in the morning and 34 units at night.  ALLERGIES:  No known drug allergies.  PHYSICAL EXAMINATION:  VITAL SIGNS:  Temperature 94.8, Blood pressure 90/60, pulse 119, respirations 20, and 98% saturations on room air.  GENERAL: This is an alert and oriented male x 3 in no acute distress.  HEENT: Normocephalic and atraumatic.  Pupils are equal, round and reactive to light.  Extraocular muscles intact, anicteric.  Ears were TMs erythematous bilaterally. Oropharynx is dry with mild erythema.  NECK:  Supple.  LUNGS: Clear.  HEART: Tachycardia.  ABDOMEN: Positive bowel sounds, soft, and nontender. EXTREMITIES: No clubbing, cyanosis, or edema.  Left foot with a questionable web deformity.  MUSCULOSKELETAL: Full strength 5/5 throughout.  NEUROLOGICAL: Nonfocal.  LABORATORY DATA:  Sodium 123, potassium 5.8, chloride 93, bicarb 7, BUN 31, creatinine 1.9, and glucose greater than 700.  ABG; pH 7.18, pCO2 15.1, and pO2 178.  Strep screen was negative.  CBC; white count 14.0, hemoglobin 16, platelets 512.  CMP; sodium 126, potassium 5.6, chloride 82, bicarb 14, BUN 37, creatinine  2.3, glucose 888.  Calcium 9.3, total protein 7.3, albumin 3.9, AST 19, ALT 30, total bilirubin 3.0, and alkaline phosphatase 145.  HOSPITAL COURSE:  #1 - Diabetic ketoacidosis.  Mr. Aldape was found to be in diabetic ketoacidosis on arrival to the ER.  At that time he was noted to have electrolyte abnormalities including an anion gap of 30 as well as ketones that were greater than 80 in his urine, and was also noted to be acidotic at a pH of 7.18.  At that point, Mr. Nanda was aggressively started on fluid rehydration as well as an  insulin drip.  CBGs and BMPs were checked frequently. Over the next 24 hours, Mr. Clabo blood sugars normalized.  In addition his anion gap closed and his other electrolyte abnormalities resolved.  During that time period, as these things resolved, Mr. Duggar began to take p.o. and the insulin drip was discontinued and subcutaneous insulin with sliding scale insulin coverage was initiated.  Because of some episodes of hypoglycemia during the change from insulin drip to subcutaneous insulin, the subcutaneous insulin was decreased from Mr. Letizia initial home regimen of 44 units in the morning and 34 units in the afternoon.  He was maintained on 20 units of subcutaneous insulin on the day prior to discharge and his blood sugars did remain slightly elevated in the 212 to 271 range.  Therefore, prior to discharge, Mr. Samons insulin was increased to a dose greater than the 20 units b.i.d., but not as high as what he had been taking prior to admission. He was discharged on 30 units q.a.m. and 25 units q.h.s.  He was instructed to check his blood sugars frequently before meals and in the evening.  He was also instructed to call the hospital and speak to the resident on call if his blood sugars were persistently elevated to a great degree and he began to have a return of symptoms.  In addition, he was reminded of the necessary precautions with hypoglycemic symptoms such as tremulousness, dizziness, palpitations, and confusion.  #2 - Acute renal failure.  On presentation to the ER, Mr. Karasik had a creatinine of 2.3 which was thought to be prerenal secondary to dehydration. His creatinine resolved nicely until the day of discharge it was 0.8.  #3 - Recent URI symptoms.  Mr. Alipio did complain of a nonproductive cough as well as a bad sinus headache and purulent rhinorrhea on admission.  His blood cultures were negative prior to discharge and a chest x-ray was negative.  He was started on Rocephin and  Zithromax on the day of admission.  He was then  changed over to Zithromax p.o. on December 28, which was the next day and then finally he was sent home to complete a five-day total course of Zithromax 500 mg once a day.  Therefore, he did receive two additional days worth of Zithromax.  DISCHARGE LABORATORY DATA:  White count 6.2, hemoglobin 14.1, platelets 337, hematocrit 40.1.  Sodium 133, potassium 3.6, chloride 101, bicarb 27, BUN 7, creatinine 0.8, glucose 176, calcium 7.7.  Respiratory cultures with normal flora.  Ferritin 188, iron 116, TIBC 233, and percent saturation 50.  Lipids; total cholesterol 174, HDL 48, LDL 97, and total triglycerides 145.  Mr. Laughter was discharged to home in stable condition. DD:  12/02/00 TD:  12/02/00 Job: [id number]

## 2011-04-17 NOTE — Consult Note (Signed)
NAME:  [name], [name]                          ACCOUNT NO.:  [id number]   MEDICAL RECORD NO.:  [id number]                   PATIENT TYPE:  EMS   LOCATION:  MAJO                                 FACILITY:  [facility]   PHYSICIAN:  Kristine Garbe. Ezzard Standing, M.D.         DATE OF BIRTH:  Jul 01, 1966   DATE OF CONSULTATION:  01/09/2004  DATE OF DISCHARGE:                                   CONSULTATION   REFERRING PHYSICIAN:  Dr. Carleene Cooper.   REASON FOR EMERGENCY ROOM VISIT:  The patient apparently has a left upper  lip abscess and was consulted for incision and drainage.   The patient is an insulin-dependent diabetic who developed a sore of his  left upper lip and was treated with outpatient antibiotics, amoxicillin and  then Septra, but the lip continues to get worse and presents to the  emergency room with a swollen left upper lip.   PHYSICAL EXAMINATION:  HEENT:  On examination, the patient has what appears  to be a cellulitis and a small abscess of the left upper lip.  It really  does not extend onto the face but involves the left upper lip.   DESCRIPTION OF PROCEDURE:  The lip was injected with 3 mL of Xylocaine with  epinephrine for hemostasis.  An incision was made in the mucosal side of the  lip into the abscess.  A culture was obtained.  The abscess was then opened  up and the pus was drained and the quarter-inch Iodoform gauze was placed  within the abscess to continue drainage.   IMPRESSION:  Left upper lip abscess in a diabetic.   RECOMMENDATION:  Incision and drainage was performed in the emergency room.  Mother was instructed to remove the Iodoform gauze packing in the morning.  I placed him on Keflex 500 mg 4 times daily for 1 week.  He received 2 g of  Ancef in the emergency room, along with a liter of fluid.  Will notify my  office if symptoms have not significantly improved in 48 hours.                                               Kristine Garbe. Ezzard Standing, M.D.    CEN/MEDQ  D:  01/09/2004  T:  01/09/2004  Job:  [id number]

## 2011-04-17 NOTE — Discharge Summary (Signed)
[name], [name] NO.:  [id number]   MEDICAL RECORD NO.:  [id number]          PATIENT TYPE:  INP   LOCATION:  4711                         FACILITY:  [facility]   PHYSICIAN:  Michaelyn Barter, M.D. DATE OF BIRTH:  February 27, 1966   DATE OF ADMISSION:  07/03/2005  DATE OF DISCHARGE:  07/06/2005                                 DISCHARGE SUMMARY   PRIMARY CARE PHYSICIAN:  Unassigned.   FINAL DIAGNOSES AT THE TIME OF DISCHARGE:  1.  Diabetic ketoacidosis.  2.  Renal failure.  3.  Hyperkalemia.  4.  Hyponatremia.  5.  Cocaine abuse.  6.  Alcohol abuse.  7.  Intractable nausea and emesis.  8.  Dehydration.   PROCEDURES:  Portable chest x-ray completed July 03, 2005.   HISTORY OF PRESENT ILLNESS:  [name] is a 45 year old gentleman who was  admitted secondary to chief complaint of nausea and vomiting.  He stated  that these symptoms started early in the week and went on to state that he  suspected sugars were high and that he had experienced similar symptoms  previously.  He denied having fever or chills and stated that he had been  taking his insulin but not sure of the dosage to actually give himself  secondary to running out of his glucose strips.  He also complained of a  single episode of diarrhea earlier in the week but did not have any urinary  symptoms.   PAST MEDICAL HISTORY:  1.  Diabetes mellitus, insulin dependent diagnosed at the age of 42.  2.  Diabetic ketoacidosis with multiple prior admissions.  3.  Nonhealing ulcer on the right toe requiring amputation which was      completed November 2005.  4.  Osteomyelitis of the right middle toe.  5.  DVT of the right calf.  6.  PE.   PAST SURGICAL HISTORY:  1.  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  2.  Left foot operation as a child secondary to poor development at which      time bone was removed from patient's hip and transplanted to the left      foot.   SOCIAL HISTORY:  1.  Crack cocaine: The  patient confesses to smoking crack cocaine and states      that the last time he smoked was 3 days prior to admission and states he      uses it every day.  2.  Alcohol consumption: The patient consumes at least one 40-ounce beer      every day.  3.  Cigarettes: The patient denies.   HOSPITAL COURSE:  #1.  DIABETIC KETOACIDOSIS:  The patient was started on a Glucomander and  admitted into the medical intensive care unit for closer observation.  He  received very aggressive IV hydration which had bicarbonate added to it.  By  the following day, the patient stated that he felt better.  His nausea and  emesis had resolved.  Likewise, sugars were noted to have started declining.  Of note also that when patient first was admitted to the emergency room,  his  glucose was checked, and the meter read greater than 700.  By the following  day, his sugars had started to improve significantly, and it appeared DKA  had started to resolve.  Likewise, the patient had an ABG completed the  following day.  His pH was noted to be 7.433, PCO2 32.1, PO2 86.6, bicarb  22.9 which was a significant improvement over his ABG at the time of  admission when his pH was noted to have been 7.122, PCO2 21.4, and bicarb 7.  By the following day, August 7, the patient stated that he felt back to his  baseline and requested to be discharged home from the hospital.  He had no  complaints at that particular time.   #2.  RENAL FAILURE:  When the patient arrived to the emergency room, his  creatinine was believed to be 6.5.  This more than likely was secondary to  severe volume depletion resulting from his multiple episodes of emesis.  Again with fluids aggressively being provided to the patient, his creatinine  by the following day was noted to have improved to 2.  By the date of  discharge, it was noted to have declined to 0.7; so there was a significant  improvement over the course of this hospitalization.   #3.  NAUSEA  AND EMESIS:  This resolved over the course of his  hospitalization.  He did receive p.r.n. antiemetic medication over the  course of his hospitalization.   #4.  HYPERKALEMIA: This was monitored closely over the course of his  hospitalization.  When the patient initially arrived into the hospital, his  potassium was noted to be 6.1.  By the following day, it had declined to 3.8  and by August 6, declined to 3.2, requiring potassium supplementation be  given to the patient.   #5.  HYPONATREMIA:  On the date of admission, the patient's sodium was noted  to be 119.  This was believed to be secondary to the hyperosmotic and  hyperglycemia stemming from the patient's DKA.  As the patient's DKA  resolved, his sodium also corrected itself.  By the day following his  admission to the hospital, his sodium was noted to be 142, and at the time  of discharge, his sodium was 144.   #6.  DEHYDRATION: This resolved with aggressive IV fluid resuscitation over  the course of his hospitalization   #7.  COCAINE ABUSE: The patient had no signs of withdrawal over the course  of hospitalization.  This was monitored closely.   #8.  ALCOHOL ABUSE:  Likewise, the patient did not manifest any signs of  alcohol withdrawal over the course of his hospitalization.   The decision was made to discharge the patient home.  On the date of the  patient's discharge, he stated that he felt back to his regular self and  requested to be discharged home.  He stated that his nausea and his emesis  had resolved.  Vital signs on the date of discharge were temperature 98.7,  heart rate 80, respirations 20, blood pressure 138/85, saturation 96% on  room air.  CBGs were noted to be 120, 71, and 177.  The decision was made to  discharge the patient home.  The patient was discharged home on the  following medications.   1.  Lantus insulin 30 units subcutaneously nightly.  2.  Protonix 40 mg once a day. 3.  Phenergan 12.5 mg 1  tablet q.8 h. p.r.n. for nausea.  He was instructed to take all of his medications as prescribed and to follow  up with his primary care physician within one to four weeks.  In addition,  he was told to stop using crack cocaine, stop consuming alcohol.      Michaelyn Barter, M.D.  Electronically Signed     OR/MEDQ  D:  09/11/2005  T:  09/11/2005  Job:  [id number]

## 2011-04-17 NOTE — H&P (Signed)
NAME:  EWART, CARRERA                          ACCOUNT NO.:  [id number]   MEDICAL RECORD NO.:  [id number]                   PATIENT TYPE:  OIB   LOCATION:  5032                                 FACILITY:  [facility]   PHYSICIAN:  Lenard Galloway. Chaney Malling, M.D.           DATE OF BIRTH:  30-Mar-1966   DATE OF ADMISSION:  06/19/2004  DATE OF DISCHARGE:                                HISTORY & PHYSICAL   CHIEF COMPLAINT:  Increased drainage right foot wound.   HISTORY OF PRESENT ILLNESS:  This 45 year old white male patient presented  to Dr. Chaney Malling with a history of problems of his right foot in the past.  He had had chronic left foot infection and problems with infections in other  areas of his body when, in June prior to his admission on June 13, he had  tripped and stubbed the third toe of his right foot on a vacuum cleaner.  He  did not have any problems initially but gradually had increased pain,  swelling, redness, and had some bleeding with purulent discharge.  He was  admitted at that time and had an irrigation and debridement of that right  foot.  The wound did eventually grow out methicillin sensitive Staph aureus  with Streptococcus viridans.  He did have history of previous MRSA infection  on the left foot in the past.  He refused PICC placement at that time for IV  vancomycin and was discontinued home on Zyvox and Cipro.  He could not  afford the Zyvox so  he just continued on the Cipro.   He presented to Dr. Chaney Malling in our office on July 20 with increased  temperature and drainage from that right foot.  X-ray at that time showed  the third metatarsal head with changes consistent with osteomyelitis and  destruction of the proximal phalanx of the right third toe.  He reports  after he left the hospital, he did try to get assistance for coverage for  the Zosyn but he was unable to obtain that.   ALLERGIES:  No known drug allergies.   CURRENT MEDICATIONS:  1. Novolog 70/30 insulin  which he takes several times a day.  2. Cipro.  3. Darvocet N100, 1-2 p.o. q.4h. p.r.n. pain.   PAST MEDICAL HISTORY:  1. Type 1 diabetes mellitus, very labile.  2. History of chronic foot infections.   PAST SURGICAL HISTORY:  1. Bilateral foot surgeries.  2. Surgery on the left hip to harvest bone for his foot surgery.  3. Laser eye surgery.  4. Incision and drainage left thigh abscess February 2005.  5. Incision and drainage right foot wound May 12, 2004.  6. Incision and drainage necrotic tissue inner aspect left foot with     resection of first metatarsal head by Dr. Claude Manges. [place] Apr 05, 2001.   SOCIAL HISTORY:  Denies any history of cigarette smoking, alcohol use, or  drug use.  He lives with his family.  His primary medical doctor is Dr.  Jeannetta Nap in [place] and Dr. Chestine Spore for his diabetes.   FAMILY HISTORY:  Noncontributory.   REVIEW OF SYMPTOMS:  He does have some problems with constipation.  He does  have pain in both his feet.  He does wear glasses all the time.  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   PHYSICAL EXAMINATION:  GENERAL:  Well developed, well nourished, thin white male in no acute  distress.  Height 5 feet 10 inches, weight 178 pounds.  VITAL SIGNS:  Temperature 102, pulse 121, blood pressure 121/75.  HEENT:  Normocephalic, atraumatic, without frontal or maxillary sinus  tenderness to palpation.  Conjunctivae pink, sclerae anicteric.  PERRLA.  EOMs intact.  Hearing grossly intact.  Nose:  Nasal septum midline.  Nasal  mucosa pink and moist without exudates or polyps noted.  Oral mucosa is pink  and moist.  NECK:  No visible masses or lesions noted.  Trachea midline.  Full range of  motion of the cervical spine without pain.  CARDIOVASCULAR:  Heart rate and rhythm regular.  S1 and S2 present without  rubs, clicks, or murmurs noted.  RESPIRATORY:  Respirations even and unlabored.  Breath sounds clear to  auscultation  bilaterally without rales or wheezes noted.  ABDOMEN:  Round abdominal contour.  Bowel sounds present x 4 quadrants.  Soft and nontender to palpation without hepatosplenomegaly or CVA  tenderness.  BREASTS/GU/RECTAL:  These exams are deferred at this time.  MUSCULOSKELETAL:  Dressing intact to the right foot.  The wound is red.  There is a fair amount of serosanguineous purulent drainage from the wound.  He does have skin intact about the left foot.  The skin is, otherwise,  intact without redness.  He has full range of motion of the knees and hips  bilaterally.  Full range of motion of the upper extremities without pain.  NEUROLOGICAL:  Alert and oriented x 3.  Cranial nerves 2-12 are grossly  intact.  Strength in upper and lower extremities 5/5.   RADIOLOGICAL FINDINGS:  X-rays taken in our office on July 20 showed active  osteomyelitis of the third metatarsal head and also destruction of the  proximal phalanx of the right third toe.   IMPRESSION:  1. Osteomyelitis right third toe and metatarsal head.  2. Brittle insulin dependent diabetic.  3. History of multiple chronic foot infections.  4. History of methicillin sensitive Staph aureus.   PLAN:  Mr. Brubacher is being admitted on June 19, 2004, for irrigation,  debridement, and partial ray amputation of the right foot osteomyelitis.  He  will be placed on IV antibiotics.  We will consult medicine for his brittle  diabetes.      Legrand Pitts Duffy, P.A.                      Rodney A. Chaney Malling, M.D.    KED/MEDQ  D:  06/20/2004  T:  06/20/2004  Job:  [id number]

## 2012-03-01 ENCOUNTER — Other Ambulatory Visit: Payer: Self-pay | Admitting: Orthopedic Surgery

## 2012-03-01 DIAGNOSIS — IMO0002 Reserved for concepts with insufficient information to code with codable children: Secondary | ICD-10-CM

## 2013-05-08 ENCOUNTER — Inpatient Hospital Stay (HOSPITAL_COMMUNITY)
Admission: EM | Admit: 2013-05-08 | Discharge: 2013-05-11 | DRG: 640 | Disposition: A | Discharge status: Home / Self Care | Payer: Medicare Other | Attending: Internal Medicine | Admitting: Internal Medicine

## 2013-05-08 ENCOUNTER — Encounter (HOSPITAL_COMMUNITY): Payer: Self-pay | Admitting: Emergency Medicine

## 2013-05-08 ENCOUNTER — Emergency Department (HOSPITAL_COMMUNITY): Payer: Medicare Other

## 2013-05-08 DIAGNOSIS — E876 Hypokalemia: Secondary | ICD-10-CM | POA: Diagnosis not present

## 2013-05-08 DIAGNOSIS — S88119A Complete traumatic amputation at level between knee and ankle, unspecified lower leg, initial encounter: Secondary | ICD-10-CM

## 2013-05-08 DIAGNOSIS — R569 Unspecified convulsions: Secondary | ICD-10-CM

## 2013-05-08 DIAGNOSIS — K759 Inflammatory liver disease, unspecified: Secondary | ICD-10-CM | POA: Diagnosis present

## 2013-05-08 DIAGNOSIS — G40401 Other generalized epilepsy and epileptic syndromes, not intractable, with status epilepticus: Secondary | ICD-10-CM | POA: Diagnosis present

## 2013-05-08 DIAGNOSIS — G9340 Encephalopathy, unspecified: Secondary | ICD-10-CM | POA: Diagnosis present

## 2013-05-08 DIAGNOSIS — E109 Type 1 diabetes mellitus without complications: Secondary | ICD-10-CM | POA: Diagnosis present

## 2013-05-08 DIAGNOSIS — G40901 Epilepsy, unspecified, not intractable, with status epilepticus: Secondary | ICD-10-CM

## 2013-05-08 DIAGNOSIS — E871 Hypo-osmolality and hyponatremia: Principal | ICD-10-CM | POA: Diagnosis present

## 2013-05-08 DIAGNOSIS — Z794 Long term (current) use of insulin: Secondary | ICD-10-CM

## 2013-05-08 DIAGNOSIS — R252 Cramp and spasm: Secondary | ICD-10-CM | POA: Diagnosis present

## 2013-05-08 DIAGNOSIS — R739 Hyperglycemia, unspecified: Secondary | ICD-10-CM

## 2013-05-08 DIAGNOSIS — I10 Essential (primary) hypertension: Secondary | ICD-10-CM | POA: Diagnosis present

## 2013-05-08 DIAGNOSIS — R631 Polydipsia: Secondary | ICD-10-CM | POA: Diagnosis present

## 2013-05-08 DIAGNOSIS — E785 Hyperlipidemia, unspecified: Secondary | ICD-10-CM | POA: Diagnosis present

## 2013-05-08 DIAGNOSIS — R7309 Other abnormal glucose: Secondary | ICD-10-CM

## 2013-05-08 HISTORY — DX: Essential (primary) hypertension: I10

## 2013-05-08 HISTORY — DX: Inflammatory liver disease, unspecified: K75.9

## 2013-05-08 HISTORY — DX: Type 2 diabetes mellitus without complications: E11.9

## 2013-05-08 LAB — CBC WITH DIFFERENTIAL/PLATELET
Basophils Absolute: 0 10*3/uL (ref 0.0–0.1)
Basophils Relative: 0 % (ref 0–1)
Eosinophils Absolute: 0.1 10*3/uL (ref 0.0–0.7)
MCH: 29.7 pg (ref 26.0–34.0)
MCHC: 35.9 g/dL (ref 30.0–36.0)
Neutrophils Relative %: 69 % (ref 43–77)
Platelets: 249 10*3/uL (ref 150–400)

## 2013-05-08 LAB — URINALYSIS, ROUTINE W REFLEX MICROSCOPIC
Leukocytes, UA: NEGATIVE
Nitrite: NEGATIVE
Protein, ur: NEGATIVE mg/dL
Urobilinogen, UA: 1 mg/dL (ref 0.0–1.0)

## 2013-05-08 LAB — TROPONIN I: Troponin I: <0.3 ng/mL (ref <0.30)

## 2013-05-08 LAB — POCT I-STAT, CHEM 8
Calcium, Ion: 1.01 mmol/L — ABNORMAL LOW (ref 1.12–1.23)
HCT: 42 % (ref 39.0–52.0)
TCO2: 23 mmol/L (ref 0–100)

## 2013-05-08 LAB — AMMONIA: Ammonia: 19 umol/L (ref 11–60)

## 2013-05-08 LAB — RAPID URINE DRUG SCREEN, HOSP PERFORMED
Opiates: NOT DETECTED
Tetrahydrocannabinol: NOT DETECTED

## 2013-05-08 MED ORDER — LORAZEPAM 2 MG/ML IJ SOLN
1.0000 mg | Freq: Once | INTRAMUSCULAR | Status: AC
  Administered 2013-05-08: 1 mg via INTRAVENOUS
  Filled 2013-05-08: qty 1

## 2013-05-08 MED ORDER — SODIUM CHLORIDE 3 % IV SOLN
INTRAVENOUS | Status: DC
  Administered 2013-05-09: 01:00:00 via INTRAVENOUS
  Filled 2013-05-08 (×2): qty 500

## 2013-05-08 MED ORDER — SODIUM CHLORIDE 0.9 % IV SOLN
Freq: Once | INTRAVENOUS | Status: AC
  Administered 2013-05-08: 23:00:00 via INTRAVENOUS

## 2013-05-08 MED ORDER — DEXTROSE 5 % IV SOLN: 1.0000 g | INTRAVENOUS | Status: DC

## 2013-05-08 MED ORDER — DEXTROSE 5 % IV SOLN: 500.0000 mg | Freq: Once | INTRAVENOUS | Status: DC

## 2013-05-08 NOTE — ED Provider Notes (Signed)
History     CSN: [id number]  Arrival date & time 05/08/13  2124   First MD Initiated Contact with Patient 05/08/13 2126      Chief Complaint  Patient presents with  . Altered Mental Status  . Emesis    (Consider location/radiation/quality/duration/timing/severity/associated sxs/prior treatment) Patient is a 47 y.o. male presenting with altered mental status.  Altered Mental Status Presenting symptoms: behavior changes, confusion, disorientation and partial responsiveness   Severity:  Severe Most recent episode:  Today Episode history:  Single Timing:  Constant Progression:  Worsening Chronicity:  New Context: alcohol use (in past, none reported acutely)   Context: taking medications as prescribed and not a recent change in medication   Associated symptoms: vomiting   Associated symptoms: no abdominal pain, no fever, no headaches, no light-headedness, no nausea and no rash   Associated symptoms comment:  Urinary incontinence, R sided focal movements, no visual tracking to R   Past Medical History  Diagnosis Date  . Hypertension   . Diabetes mellitus without complication   . Hepatitis     History reviewed. No pertinent past surgical history.  No family history on file.  History  Substance Use Topics  . Smoking status: Not on file  . Smokeless tobacco: Not on file  . Alcohol Use: Not on file      Review of Systems  Constitutional: Negative for fever and chills.  HENT: Negative for congestion, sore throat and rhinorrhea.   Eyes: Negative for photophobia and visual disturbance.  Respiratory: Negative for cough and shortness of breath.   Cardiovascular: Negative for chest pain and leg swelling.  Gastrointestinal: Positive for vomiting. Negative for nausea, abdominal pain, diarrhea and constipation.  Endocrine: Negative for polydipsia and polyuria.  Genitourinary: Negative for dysuria and hematuria.  Musculoskeletal: Negative for back pain and arthralgias.  Skin:  Negative for color change and rash.  Neurological: Negative for dizziness, syncope, light-headedness and headaches.  Hematological: Negative for adenopathy. Does not bruise/bleed easily.  Psychiatric/Behavioral: Positive for confusion and altered mental status.  All other systems reviewed and are negative.    Allergies  Review of patient's allergies indicates no known allergies.  Home Medications   Current Outpatient Rx  Name  Route  Sig  Dispense  Refill  . atorvastatin (LIPITOR) 40 MG tablet   Oral   Take 40 mg by mouth daily.         . insulin lispro (HUMALOG) 100 UNIT/ML injection   Subcutaneous   Inject into the skin 3 (three) times daily before meals. Sliding scale         . lisinopril (PRINIVIL,ZESTRIL) 20 MG tablet   Oral   Take 10 mg by mouth daily.         . pregabalin (LYRICA) 150 MG capsule   Oral   Take 150 mg by mouth 2 (two) times daily.           BP 145/99  Pulse 80  Temp(Src) 98.1 F (36.7 C)  Resp 18  SpO2 100%  Physical Exam  Vitals reviewed. Constitutional: He is oriented to person, place, and time. He appears well-developed and well-nourished.  HENT:  Head: Normocephalic and atraumatic.  Eyes: Conjunctivae and EOM are normal.  Neck: Normal range of motion. Neck supple.  Cardiovascular: Normal rate, regular rhythm and normal heart sounds.   Pulmonary/Chest: Effort normal and breath sounds normal. No respiratory distress.  Abdominal: He exhibits no distension. There is no tenderness. There is no rebound and no guarding.  Musculoskeletal: Normal range of motion.  Neurological: He is alert and oriented to person, place, and time. He displays tremor. A cranial nerve deficit (will not track pass midline on R, face symetric, PERL) is present. Sensory deficit: unable to elicit. He exhibits abnormal muscle tone (Increased tone in RUE). GCS eye subscore is 4. GCS verbal subscore is 2. GCS motor subscore is 5.  Skin: Skin is warm and dry.    ED  Course  Procedures (including critical care time)  Labs Reviewed  COMPREHENSIVE METABOLIC PANEL - Abnormal; Notable for the following:    Sodium 115 (*)    Chloride 78 (*)    Glucose, Bld 194 (*)    All other components within normal limits  CBC WITH DIFFERENTIAL - Abnormal; Notable for the following:    HCT 37.0 (*)    All other components within normal limits  URINALYSIS, ROUTINE W REFLEX MICROSCOPIC - Abnormal; Notable for the following:    Glucose, UA 500 (*)    Ketones, ur 40 (*)    All other components within normal limits  POCT I-STAT, CHEM 8 - Abnormal; Notable for the following:    Sodium 118 (*)    Chloride 81 (*)    Glucose, Bld 195 (*)    Calcium, Ion 1.01 (*)    All other components within normal limits  URINE RAPID DRUG SCREEN (HOSP PERFORMED)  ETHANOL  AMMONIA  TROPONIN I  OSMOLALITY  OSMOLALITY, URINE   Ct Head Wo Contrast  05/08/2013   *RADIOLOGY REPORT*  Clinical Data: Vomiting, altered mental status  CT HEAD WITHOUT CONTRAST  Technique:  Contiguous axial images were obtained from the base of the skull through the vertex without contrast.  Comparison: None.  Findings: Atherosclerotic and physiologic intracranial calcifications.    There is no evidence of acute intracranial hemorrhage, brain edema, mass lesion, acute infarction,   mass effect, or midline shift. Acute infarct may be inapparent on noncontrast CT.  No other intra-axial abnormalities are seen, and the ventricles and sulci are within normal limits in size and symmetry.   No abnormal extra-axial fluid collections or masses are identified.  No significant calvarial abnormality.  IMPRESSION: 1. Negative for bleed or other acute intracranial process.   Original Report Authenticated By: D. Andria Rhein, MD   Dg Chest Port 1 View  05/08/2013   *RADIOLOGY REPORT*  Clinical Data: Chest pain, shortness of breath  PORTABLE CHEST - 1 VIEW  Comparison: 07/13/2010  Findings: Patchy interstitial opacities at the right  lung base possibly early infiltrate.  Left lung clear.  No effusion.  Heart size normal.  Regional bones unremarkable.  IMPRESSION:  1.  Right lower lung interstitial opacities, possibly developing infiltrate.   Original Report Authenticated By: D. Andria Rhein, MD     1. Status epilepticus   2. Hyponatremia      Date: 05/09/2013  Rate: 93  Rhythm: normal sinus rhythm  QRS Axis: normal  Intervals: normal  ST/T Wave abnormalities: normal  Conduction Disutrbances:none  Narrative Interpretation:   Old EKG Reviewed: none available    MDM  47 y.o. male  with pertinent PMH of HTN, DM presents with altered mental status, with last known normal status 3 hours prior to arrival, however pt had been at work all day and was only briefly seen on return home 3 hours ago, however at that time appeared normal.  Family found pt partially responsive some time later without obvious signs of trauma, EMS contacted and arrived to find  pt disoriented however verbal, moving all extremities well and able to ambulate to stretcher.  Enroute pt vomited and had urinary incontinence, became nonverbal and stopped being able to track past midline.  On arrival vitals and physical exam as above.  Pt moves all extremities, however he is not tracking past midline and has increased RUE tone (no RLE secondary to amputation).  Do not feel pt acutely warrants code stroke due to MAE, no facial asymetry.  CT head unremarkable.  Labs as above demonstrated hyponatremia, pt has ho ETOH use, however none reported currently.  Pt given ativan and became alert and verbal for around 30 minutes.  Concern for hyponatremic seizures, started on hypertonic saline.  Consulted critical care for admission.    Labs and imaging as above reviewed by myself and attending,Dr. Hyacinth Meeker, with whom case was discussed.   1. Status epilepticus   2. Hyponatremia             Noel Gerold, MD 05/09/13 0045

## 2013-05-08 NOTE — ED Provider Notes (Signed)
Patient presents by ambulance after family members found the patient in the hallway of his home acting abnormally, urinated on himself in the middle of the hallway and was not answering questions appropriately. The patient initially was not able to speak very much, he was in was a couple of words in route but at this time he is back to mumbling gibberish. According to family members per the paramedic personnel the patient had gone to work today, came home and was like this when he was at home. The patient is unable to give [place] any other information, level V caveat apply secondary to altered mental status.  On exam the patient is definitely confused, unable to answer questions, his words are garbled and don't make any sense, he does not answer portions appropriately, he does not follow commands, he has a right hemi-neglect, seems able to move all 4 extremities though he does not do this to command. Heart is clear, lungs are clear, abdomen is soft, pupils are equal, no asymmetry him unable to open his mouth, no signs of head trauma.  Workup to include CT scan of the head, metabolic and cardiac.  As part of the w/u the pt had ongoing seizures and appeared to be unstable from that standpoint.  Though he had improved initially with ativan, the seizures recurred shortly therafter.  I have decided to start hypertonic saline to the ativan.  He is currently on cardiac and close neurologic monitoring.  REsident physician has spoken with ICU team who will send physician to evaluate pt for admission.  CRITICAL CARE Performed by: Vida Roller Total critical care time: 35 Critical care time was exclusive of separately billable procedures and treating other patients. Critical care was necessary to treat or prevent imminent or life-threatening deterioration. Critical care was time spent personally by me on the following activities: development of treatment plan with patient and/or surrogate as well as nursing,  discussions with consultants, evaluation of patient's response to treatment, examination of patient, obtaining history from patient or surrogate, ordering and performing treatments and interventions, ordering and review of laboratory studies, ordering and review of radiographic studies, pulse oximetry and re-evaluation of patient's condition.    Diagnosis #1 - status epilepticus Diagnosis #2 - hyponatremia  Diagnosis #3 -  Community Acquired pneumonia.  Vida Roller, MD 05/10/13 [phone]

## 2013-05-08 NOTE — ED Notes (Signed)
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  Pupils are non reactive and dilated.  Patient is unable to focus for long periods of time.  LSN by family yesterday.  Now with trouble walking.

## 2013-05-09 ENCOUNTER — Encounter (HOSPITAL_COMMUNITY): Payer: Self-pay | Admitting: *Deleted

## 2013-05-09 DIAGNOSIS — R739 Hyperglycemia, unspecified: Secondary | ICD-10-CM

## 2013-05-09 DIAGNOSIS — E871 Hypo-osmolality and hyponatremia: Secondary | ICD-10-CM

## 2013-05-09 DIAGNOSIS — R569 Unspecified convulsions: Secondary | ICD-10-CM

## 2013-05-09 DIAGNOSIS — G9340 Encephalopathy, unspecified: Secondary | ICD-10-CM

## 2013-05-09 DIAGNOSIS — E109 Type 1 diabetes mellitus without complications: Secondary | ICD-10-CM

## 2013-05-09 LAB — COMPREHENSIVE METABOLIC PANEL
ALT: 33 U/L (ref 0–53)
AST: 35 U/L (ref 0–37)
CO2: 23 mEq/L (ref 19–32)
Chloride: 78 mEq/L — ABNORMAL LOW (ref 96–112)
Creatinine, Ser: 0.62 mg/dL (ref 0.50–1.35)
GFR calc non Af Amer: >90 mL/min (ref >90)
Glucose, Bld: 194 mg/dL — ABNORMAL HIGH (ref 70–99)
Total Bilirubin: 1.2 mg/dL (ref 0.3–1.2)

## 2013-05-09 LAB — BASIC METABOLIC PANEL
BUN: 15 mg/dL (ref 6–23)
CO2: 29 mEq/L (ref 19–32)
Calcium: 7.9 mg/dL — ABNORMAL LOW (ref 8.4–10.5)
Calcium: 8.1 mg/dL — ABNORMAL LOW (ref 8.4–10.5)
Chloride: 98 mEq/L (ref 96–112)
Chloride: 99 mEq/L (ref 96–112)
Chloride: 100 mEq/L (ref 96–112)
GFR calc Af Amer: >90 mL/min (ref >90)
GFR calc Af Amer: >90 mL/min (ref >90)
GFR calc Af Amer: >90 mL/min (ref >90)
GFR calc non Af Amer: >90 mL/min (ref >90)
GFR calc non Af Amer: >90 mL/min (ref >90)
Glucose, Bld: 270 mg/dL — ABNORMAL HIGH (ref 70–99)
Potassium: 3.4 mEq/L — ABNORMAL LOW (ref 3.5–5.1)
Potassium: 4.1 mEq/L (ref 3.5–5.1)
Potassium: 4.7 mEq/L (ref 3.5–5.1)
Potassium: 4.3 mEq/L (ref 3.5–5.1)
Sodium: 122 mEq/L — ABNORMAL LOW (ref 135–145)
Sodium: 134 mEq/L — ABNORMAL LOW (ref 135–145)
Sodium: 134 mEq/L — ABNORMAL LOW (ref 135–145)

## 2013-05-09 LAB — GLUCOSE, CAPILLARY
Glucose-Capillary: 188 mg/dL — ABNORMAL HIGH (ref 70–99)
Glucose-Capillary: 84 mg/dL (ref 70–99)
Glucose-Capillary: 446 mg/dL — ABNORMAL HIGH (ref 70–99)
Glucose-Capillary: 181 mg/dL — ABNORMAL HIGH (ref 70–99)

## 2013-05-09 LAB — CHLORIDE, URINE, RANDOM: Chloride Urine: 30 mEq/L

## 2013-05-09 MED ORDER — LORAZEPAM 2 MG/ML IJ SOLN: 2.0000 mg | INTRAMUSCULAR | Status: DC | PRN

## 2013-05-09 MED ORDER — INSULIN ASPART 100 UNIT/ML ~~LOC~~ SOLN
0.0000 [IU] | Freq: Three times a day (TID) | SUBCUTANEOUS | Status: DC
  Administered 2013-05-10: 2 [IU] via SUBCUTANEOUS
  Administered 2013-05-10: 11 [IU] via SUBCUTANEOUS
  Administered 2013-05-11: 2 [IU] via SUBCUTANEOUS

## 2013-05-09 MED ORDER — INSULIN ASPART 100 UNIT/ML ~~LOC~~ SOLN
0.0000 [IU] | Freq: Every day | SUBCUTANEOUS | Status: DC
  Administered 2013-05-10: 2 [IU] via SUBCUTANEOUS

## 2013-05-09 MED ORDER — INSULIN GLARGINE 100 UNIT/ML ~~LOC~~ SOLN
15.0000 [IU] | Freq: Every day | SUBCUTANEOUS | Status: DC
  Administered 2013-05-09: 15 [IU] via SUBCUTANEOUS
  Filled 2013-05-09 (×2): qty 0.15

## 2013-05-09 MED ORDER — LIDOCAINE HCL 2 % EX GEL: Freq: Once | CUTANEOUS | Status: DC

## 2013-05-09 MED ORDER — ONDANSETRON HCL 4 MG/2ML IJ SOLN: 4.0000 mg | Freq: Once | INTRAMUSCULAR | Status: AC

## 2013-05-09 MED ORDER — INSULIN ASPART 100 UNIT/ML ~~LOC~~ SOLN
17.0000 [IU] | Freq: Once | SUBCUTANEOUS | Status: AC
  Administered 2013-05-09: 17 [IU] via SUBCUTANEOUS

## 2013-05-09 MED ORDER — POTASSIUM CHLORIDE CRYS ER 20 MEQ PO TBCR
40.0000 meq | EXTENDED_RELEASE_TABLET | Freq: Once | ORAL | Status: AC
  Administered 2013-05-09: 40 meq via ORAL
  Filled 2013-05-09: qty 2

## 2013-05-09 MED ORDER — SODIUM CHLORIDE 0.9 % IV SOLN
INTRAVENOUS | Status: DC
  Administered 2013-05-09: 10:00:00 via INTRAVENOUS

## 2013-05-09 MED ORDER — INSULIN ASPART 100 UNIT/ML ~~LOC~~ SOLN
0.0000 [IU] | SUBCUTANEOUS | Status: DC
  Administered 2013-05-09 (×2): 3 [IU] via SUBCUTANEOUS
  Filled 2013-05-09: qty 3

## 2013-05-09 MED ORDER — ONDANSETRON HCL 4 MG/2ML IJ SOLN
INTRAMUSCULAR | Status: AC
  Administered 2013-05-09: 4 mg via INTRAVENOUS
  Filled 2013-05-09: qty 2

## 2013-05-09 MED ORDER — INSULIN ASPART 100 UNIT/ML ~~LOC~~ SOLN
4.0000 [IU] | Freq: Three times a day (TID) | SUBCUTANEOUS | Status: DC
  Administered 2013-05-09 – 2013-05-10 (×3): 4 [IU] via SUBCUTANEOUS

## 2013-05-09 MED ORDER — FUROSEMIDE 10 MG/ML IJ SOLN
40.0000 mg | Freq: Once | INTRAMUSCULAR | Status: AC
  Administered 2013-05-09: 40 mg via INTRAVENOUS
  Filled 2013-05-09: qty 4

## 2013-05-09 NOTE — Progress Notes (Signed)
Inpatient Diabetes Program Recommendations  AACE/ADA: New Consensus Statement on Inpatient Glycemic Control (2013)  Target Ranges:  Prepandial:   less than 140 mg/dL      Peak postprandial:   less than 180 mg/dL (1-2 hours)      Critically ill patients:  140 - 180 mg/dL  Results for [name], [name] (MRN [id number]) as of 05/09/2013 11:13  Ref. Range 05/09/2013 01:16 05/09/2013 03:58 05/09/2013 07:46  Glucose-Capillary Latest Range: 70-99 mg/dL 119 (H) 147 (H) 84     Note: Talked with patient about diabetes and blood glucose control.  According to the patient he was diagnosed at the age of 47 years old and he takes Lantus 16 units QHS and Humalog 6-12 units TID at home for diabetes management.  His blood glucose target is 100 mg/dl and 1 unit of insulin brings him down about 20 mg/dl.  Patient reports that he checks his blood glucose at least four times a day and over the past few months his blood sugar has been from 60's to 300's ("up and down, kind of all over the place").  The last A1C in the chart was 9.4% on 07-13-2010.  In talking with the patient he reports that he has not had an A1C drawn lately but he believes that it was in the 9% range the last time it was drawn.  Patient states that Dr. Jeannetta Nap is his current PCP and manages his diabetes.  Patient states that he prefers to find a new PCP and perhaps an endocrinologist to help him get better control of his diabetes.  Patient verbalized understanding of information discussed and states that he has no further questions at this time.  Patient is currently not ordered CBGs or insulin.  Brita Romp, RN and asked if she could touch base with Dr. Craige Cotta and request CBGs with Novolog sensitive correction and possibly basal insulin.  Patient's last blood glucose was 84 mg/dl this morning at 8:29 am.  Since patient has type 1 diabetes, he will need  basal insulin to meet his basic metabolic needs along with Novolog correction insulin.  Will continue to  follow.  Thanks, Orlando Penner, RN, MSN, CCRN Diabetes Coordinator Inpatient Diabetes Program [phone]

## 2013-05-09 NOTE — H&P (Signed)
PULMONARY  / CRITICAL CARE MEDICINE  Name: [name] MRN: [id number] DOB: June 27, 1966    ADMISSION DATE:  05/08/2013 CONSULTATION DATE:  05/09/2013  REFERRING MD :  EDP PRIMARY SERVICE:  PCCM  CHIEF COMPLAINT:  Acute encephalopathy  BRIEF PATIENT DESCRIPTION: 47 yo with past medical history of DM type I brought to [facility] ED with complaints of altered mental status.   Family reports patient drinking lots of water in general and specifically on the day prior to admission while working outside.  In ED: encephalopathic, obsered to have what appeared as partial seizures improved with ativan.  Labs revealed severe hyponatremia.  PCCM was consulted.  SIGNIFICANT EVENTS / STUDIES:  6/9  Head CT >>> nad  LINES / TUBES: Foley 6/9 >>>  CULTURES:  ANTIBIOTICS:  The patient is encephalopathic and unable to provide history, which was obtained for available medical records.  HISTORY OF PRESENT ILLNESS:  47 yo with past medical history of DM type I brought to [facility] ED with complaints of altered mental status.   Family reports patient drinking lots of water in general and specifically on the day prior to admission while working outside.  In ED: encephalopathic, obsered to have what appeared as partial seizures improved with ativan.  Labs revealed severe hyponatremia.  PCCM was consulted.  PAST MEDICAL HISTORY :  Past Medical History  Diagnosis Date  . Hypertension   . Diabetes mellitus without complication   . Hepatitis    History reviewed. No pertinent past surgical history. Prior to Admission medications   Medication Sig Start Date End Date Taking? Authorizing Provider  atorvastatin (LIPITOR) 40 MG tablet Take 40 mg by mouth daily.   Yes Historical Provider, MD  insulin lispro (HUMALOG) 100 UNIT/ML injection Inject into the skin 3 (three) times daily before meals. Sliding scale   Yes Historical Provider, MD  lisinopril (PRINIVIL,ZESTRIL) 20 MG tablet Take 10 mg by mouth daily.   Yes Historical  Provider, MD  pregabalin (LYRICA) 150 MG capsule Take 150 mg by mouth 2 (two) times daily.   Yes Historical Provider, MD   No Known Allergies  FAMILY HISTORY:  No family history on file.  SOCIAL HISTORY:  has no tobacco, alcohol, and drug history on file.  REVIEW OF SYSTEMS:  Unable to provide.  INTERVAL HISTORY:  VITAL SIGNS: Temp:  [98.1 F (36.7 C)] 98.1 F (36.7 C) (06/09 2305) Pulse Rate:  [79-94] 80 (06/10 0030) Resp:  [12-22] 18 (06/10 0030) BP: (114-151)/(62-99) 145/99 mmHg (06/10 0030) SpO2:  [96 %-100 %] 100 % (06/10 0030)  HEMODYNAMICS:   VENTILATOR SETTINGS:   INTAKE / OUTPUT: Intake/Output   None     PHYSICAL EXAMINATION: General:  Appears acutely ill Neuro:  Encephalopathic, arouses to stimulation, non focal, gag / cough present HEENT:  PERRL, moist membranes Cardiovascular:  RRR, no m/r/g Lungs:  Bilateral air entry, no w/r/r Abdomen:  Soft, nontender, bowel sounds diminished Musculoskeletal:  Moves all extremities, congenital deformity LLE, BKA RLE Skin:  Intact  LABS:  Recent Labs Lab 05/08/13 2220 05/08/13 2234  HGB 13.3 14.3  WBC 7.8  --   PLT 249  --   NA 115* 118*  K 3.7 3.6  CL 78* 81*  CO2 23  --   GLUCOSE 194* 195*  BUN 16 16  CREATININE 0.62 0.70  CALCIUM 8.4  --   AST 35  --   ALT 33  --   ALKPHOS 84  --   BILITOT 1.2  --  PROT 6.6  --   ALBUMIN 4.0  --   TROPONINI <0.30  --    No results found for this basename: GLUCAP,  in the last 168 hours  CXR:  6/9 >>> No overt airspace disease  ASSESSMENT / PLAN:  PULMONARY A:  No active issues.  Protects airway for now. P:   Gaol SpO2>92 Supplemental oxygen PRN Defer intubation  CARDIOVASCULAR A: Hemodynamically stable.  No arrhythmia / ischemia.  H/o HTN. P:  Goal MAP>60 Hold Lipitor / Lisinopril while NPO.  RENAL A:  Hypervolemia hypernatremia in setting of polydipsia.  Hypokalemia, mild. P:   Trend BMP K when able to take PO Lasix 40 x 1 Fluid  restriction Defer hypertonic saline for now Serum / urine osm  GASTROINTESTINAL A:  No active issues. P:   NPO GI Px is not indicated.  HEMATOLOGIC A:  No active issues. P:  Trend CBC SCDs for DVT Px  INFECTIOUS A:  No active issues. P:   No intervention required  ENDOCRINE  A:  DM I.  Hyperglycemia.  P:   SSI  NEUROLOGIC A:  Acute encephalopathy in setting of hyponatremia.  Possible partial seizures. P:   Seizure precautions Ativan PRN Treat underlying condition Neurology consultation if no improvement after Na corrected  TODAY'S SUMMARY: Partial seizures / acute encephalopathy in setting of severe hyponatremia likely related to polydipsia. Water restriction / Lasix now and reevaluate.  Hypertonic saline if no improvement in Na or continuing seizures.  No indications for intubation right now, however may require later if seizures / decrease in mental status.  I have personally obtained a history, examined the patient, evaluated laboratory and imaging results, formulated the assessment and plan and placed orders.  CRITICAL CARE:  The patient is critically ill with multiple organ systems failure and requires high complexity decision making for assessment and support, frequent evaluation and titration of therapies, application of advanced monitoring technologies and extensive interpretation of multiple databases. Critical Care Time devoted to patient care services described in this note is 45 minutes.   Lonia Farber, MD Pulmonary and Critical Care Medicine [facility] Pager: [phone]  05/09/2013, 12:43 AM

## 2013-05-09 NOTE — ED Notes (Signed)
Improved neurologic status; gcs 14-15. Just unaware of situation. Pt. Maintaining airway. Pt. Following simple commands and gross commands.

## 2013-05-09 NOTE — Progress Notes (Signed)
UR completed.  List of local providers, both family medicine and endocrinology provided to patient at his request. Printed from the provider directory PDF on the [employer].

## 2013-05-09 NOTE — Progress Notes (Signed)
PULMONARY  / CRITICAL CARE MEDICINE  Name: [name] MRN: [id number] DOB: 12/13/1965    ADMISSION DATE:  05/08/2013 CONSULTATION DATE:  05/09/2013  REFERRING MD :  EDP PRIMARY SERVICE:  PCCM  CHIEF COMPLAINT:  Acute encephalopathy  BRIEF PATIENT DESCRIPTION:  47 yo presented to ED with projectile vomiting, change in mental status with partial seizure in setting of hyponatremia, and family report of polydypsia.  PCCM asked to admit to ICU.  PMHx HTN, DM, Hepatitis  SIGNIFICANT EVENTS / STUDIES:  6/9  Head CT >>> no acute findings  SUBJECTIVE: Feels better.  Denies headache, dizziness, blurred vision, chest pain, nausea, or dyspnea.  No further seizure activity.  VITAL SIGNS: Temp:  [97.9 F (36.6 C)-98.1 F (36.7 C)] 98.1 F (36.7 C) (06/10 0747) Pulse Rate:  [74-99] 78 (06/10 0545) Resp:  [12-22] 14 (06/10 0545) BP: (93-151)/(51-99) 97/55 mmHg (06/10 0545) SpO2:  [96 %-100 %] 99 % (06/10 0545) Weight:  [176 lb 2.4 oz (79.9 kg)] 176 lb 2.4 oz (79.9 kg) (06/10 0230) 3 liters [place]  INTAKE / OUTPUT: Intake/Output     06/09 0701 - 06/10 0700 06/10 0701 - 06/11 0700   Total Intake(mL/kg)     Urine (mL/kg/hr) 3750 650 (8.4)   Total Output 3750 650   Net -3750 -650          PHYSICAL EXAMINATION: General: No distress Neuro: Alert, follows commands, normal strength, CN intact HEENT: Pupils reactive Cardiovascular: regular, no murmur Lungs: no wheeze Abdomen:  Soft, nontender, + bowel sounds Musculoskeletal: no edema Skin: no rashes  LABS:  Recent Labs Lab 05/08/13 2220 05/08/13 2234 05/09/13 0200  HGB 13.3 14.3  --   WBC 7.8  --   --   PLT 249  --   --   NA 115* 118* 122*  K 3.7 3.6 3.4*  CL 78* 81* 86*  CO2 23  --  23  GLUCOSE 194* 195* 211*  BUN 16 16 13   CREATININE 0.62 0.70 0.62  CALCIUM 8.4  --  7.9*  AST 35  --   --   ALT 33  --   --   ALKPHOS 84  --   --   BILITOT 1.2  --   --   PROT 6.6  --   --   ALBUMIN 4.0  --   --   TROPONINI <0.30  --    --     Recent Labs Lab 05/09/13 0116 05/09/13 0358  GLUCAP 188* 184*    Imaging: Ct Head Wo Contrast  05/08/2013   *RADIOLOGY REPORT*  Clinical Data: Vomiting, altered mental status  CT HEAD WITHOUT CONTRAST  Technique:  Contiguous axial images were obtained from the base of the skull through the vertex without contrast.  Comparison: None.  Findings: Atherosclerotic and physiologic intracranial calcifications.    There is no evidence of acute intracranial hemorrhage, brain edema, mass lesion, acute infarction,   mass effect, or midline shift. Acute infarct may be inapparent on noncontrast CT.  No other intra-axial abnormalities are seen, and the ventricles and sulci are within normal limits in size and symmetry.   No abnormal extra-axial fluid collections or masses are identified.  No significant calvarial abnormality.  IMPRESSION: 1. Negative for bleed or other acute intracranial process.   Original Report Authenticated By: D. Andria Rhein, MD   Dg Chest Port 1 View  05/08/2013   *RADIOLOGY REPORT*  Clinical Data: Chest pain, shortness of breath  PORTABLE CHEST - 1 VIEW  Comparison: 07/13/2010  Findings: Patchy interstitial opacities at the right lung base possibly early infiltrate.  Left lung clear.  No effusion.  Heart size normal.  Regional bones unremarkable.  IMPRESSION:  1.  Right lower lung interstitial opacities, possibly developing infiltrate.   Original Report Authenticated By: D. Deanne Coffer III, MD   Serum Osmolality >> 251 Urine Osmolality >> 267  ASSESSMENT / PLAN:  PULMONARY A:  Atelectasis. P:   F/u CXR Oxygen as needed to keep SpO2 > 92% Bronchial hygiene  CARDIOVASCULAR A: Low blood pressure. H/o HTN, hyperlipidemia. P:  Normal saline IV fluid Hold Lipitor / Lisinopril for now  RENAL A:  Hyponatremia. Hypokalemia. P:   Normal saline IV fluid F/u BMET Replace potassium as needed Check urine Na, Cl  GASTROINTESTINAL A:  Vomiting >> resolved. Nutrition. P:    CHO modified diet  ENDOCRINE  A:  DM type I with hyperglycemia. P:   SSI  NEUROLOGIC A:  Acute encephalopathy in setting of hyponatremia with resulting partial seizures >> much improved 6/10. P:   Monitor mental status  Pt followed by Dr. Windle Guard as outpt.  Will transfer to SDU.  Will transfer to [facility] for 6/11 and PCCM sign off.  Coralyn Helling, MD [facility] Pulmonary/Critical Care 05/09/2013, 8:08 AM Pager:  [phone] After 3pm call: [phone]

## 2013-05-10 ENCOUNTER — Inpatient Hospital Stay (HOSPITAL_COMMUNITY): Payer: Medicare Other

## 2013-05-10 DIAGNOSIS — G40401 Other generalized epilepsy and epileptic syndromes, not intractable, with status epilepticus: Secondary | ICD-10-CM

## 2013-05-10 LAB — BASIC METABOLIC PANEL
CO2: 27 mEq/L (ref 19–32)
Chloride: 102 mEq/L (ref 96–112)
Glucose, Bld: 158 mg/dL — ABNORMAL HIGH (ref 70–99)
Potassium: 4 mEq/L (ref 3.5–5.1)
Sodium: 139 mEq/L (ref 135–145)

## 2013-05-10 LAB — GLUCOSE, CAPILLARY
Glucose-Capillary: 146 mg/dL — ABNORMAL HIGH (ref 70–99)
Glucose-Capillary: 325 mg/dL — ABNORMAL HIGH (ref 70–99)
Glucose-Capillary: 53 mg/dL — ABNORMAL LOW (ref 70–99)
Glucose-Capillary: 219 mg/dL — ABNORMAL HIGH (ref 70–99)

## 2013-05-10 MED ORDER — INSULIN ASPART 100 UNIT/ML ~~LOC~~ SOLN
8.0000 [IU] | Freq: Three times a day (TID) | SUBCUTANEOUS | Status: DC
  Administered 2013-05-11: 8 [IU] via SUBCUTANEOUS

## 2013-05-10 MED ORDER — INSULIN GLARGINE 100 UNIT/ML ~~LOC~~ SOLN
16.0000 [IU] | Freq: Every day | SUBCUTANEOUS | Status: DC
  Administered 2013-05-10: 16 [IU] via SUBCUTANEOUS
  Filled 2013-05-10 (×2): qty 0.16

## 2013-05-10 NOTE — ED Provider Notes (Signed)
I saw and evaluated the patient, reviewed the resident's note and I agree with the findings and plan.  I have seen, interpreted and agree with the resident's interpretation of the ECG, please see my separate note for further documentation and critical care documentation.  Vida Roller, MD 05/10/13 [phone]

## 2013-05-10 NOTE — Progress Notes (Addendum)
[facility] Progress Note [place] TEAM 1 - Stepdown/ICU TEAM   BOSTON COOKSON ZOX:[id number] DOB: [birth year] DOA: 05/08/2013 PCP: No primary provider on file.  Brief narrative: 47 yo with past medical history of DM type I brought to [facility] ED 6/10 with complaints of altered mental status. Family reported patient drinking lots of water in general and specifically on the day prior to admission while working outside. In ED: encephalopathic, obsered to have what appeared to be a partial seizure improved with ativan. Labs revealed severe hyponatremia. PCCM admitted the pt, and care was transitioned to the [facility] on 05/10/2013 as the patient proves to be stable with rapid improvement in his sodium while free water restricted.  Assessment/Plan:  Hypervolemic Hyponatremia due to polydipsia Resolved - Na normalized - urine osmolality was low, but not as low as would be consistent with an appropriate renal response, suggesting a possible dual cause of his hyponatremia, such as primary polydipsia with an element of SAIDH - he is net 6L negative fluid balance since admit - TSH and cortisol are both normal - recheck sodium in a.m. to assure is stable - review of old records to suggest alcoholism, the patient currently denies - perhaps there is an element of beer drinkers potomania  Partial seizures  CT head negative at admission - no recurrence of symptoms - felt to be due to hyponatremia  Acute encephalopathy in setting of hyponatremia Mental status appears to have returned to baseline - CT head negative at admission  Hypokalemia Resolved   DM I Poorly controlled - adjust treatment and follow  HTN BP well controlled at present   HLD Resume home medical therapy  Code Status: FULL Family Communication: No family present at time of visit Disposition Plan: Transfer to medical bed - possible discharge home in  a.m.  Consultants: PCCM  Procedures: none  Antibiotics: none  DVT prophylaxis: SCDs  HPI/Subjective: Patient is alert and interactive.  He has no complaints.  He is anxious to be discharged home.  Objective: Blood pressure 107/74, pulse 90, temperature 98.1 F (36.7 C), temperature source Oral, resp. rate 11, height 5\' 9"  (1.753 m), weight 77.4 kg (170 lb 10.2 oz), SpO2 97.00%.  Intake/Output Summary (Last 24 hours) at 05/10/13 1259 Last data filed at 05/10/13 1100  Gross per 24 hour  Intake   1015 ml  Output   1475 ml  Net   -460 ml    Exam: General: No acute respiratory distress Lungs: Clear to auscultation bilaterally without wheezes or crackles Cardiovascular: Regular rate and rhythm without murmur gallop or rub normal S1 and S2 Abdomen: Nontender, nondistended, soft, bowel sounds positive, no rebound, no ascites, no mass Extremities: No significant cyanosis, clubbing, or edema bilateral lower extremities (L LE s/p BKA)  Data Reviewed: Basic Metabolic Panel:  Recent Labs Lab 05/09/13 0200 05/09/13 0840 05/09/13 1428 05/09/13 1945 05/10/13 0233  NA 122* 134* 134* 137 139  K 3.4* 4.1 4.7 4.3 4.0  CL 86* 98 99 100 102  CO2 23 29 24 28 27   GLUCOSE 211* 88 270* 323* 158*  BUN 13 12 11 15 13   CREATININE 0.62 0.80 0.75 0.96 0.75  CALCIUM 7.9* 8.1* 8.1* 8.7 8.6   Liver Function Tests:  Recent Labs Lab 05/08/13 2220  AST 35  ALT 33  ALKPHOS 84  BILITOT 1.2  PROT 6.6  ALBUMIN 4.0    Recent Labs Lab 05/08/13 2138  AMMONIA 19   CBC:  Recent Labs Lab 05/08/13 2220  05/08/13 2234  WBC 7.8  --   NEUTROABS 5.4  --   HGB 13.3 14.3  HCT 37.0* 42.0  MCV 82.6  --   PLT 249  --    Cardiac Enzymes:  Recent Labs Lab 05/08/13 2220  TROPONINI <0.30   CBG:  Recent Labs Lab 05/09/13 1149 05/09/13 1725 05/09/13 2138 05/10/13 0817 05/10/13 1221  GLUCAP 147* 446* 181* 146* 325*    Recent Results (from the past 240 hour(s))  MRSA PCR  SCREENING     Status: None   Collection Time    05/09/13  3:01 AM      Result Value Range Status   MRSA by PCR NEGATIVE  NEGATIVE Final   Comment:            The GeneXpert MRSA Assay (FDA     approved for NASAL specimens     only), is one component of a     comprehensive MRSA colonization     surveillance program. It is not     intended to diagnose MRSA     infection nor to guide or     monitor treatment for     MRSA infections.     Studies:  Recent x-ray studies have been reviewed in detail by the Attending Physician  Scheduled Meds:  Scheduled Meds: . insulin aspart  0-15 Units Subcutaneous TID WC  . insulin aspart  0-5 Units Subcutaneous QHS  . insulin aspart  4 Units Subcutaneous TID WC  . insulin glargine  15 Units Subcutaneous QHS  . lidocaine   Urethral Once    Time spent on care of this patient:   [facility] T  [facility] Office  [phone] Pager - Text Page per Loretha Stapler as per below:  On-Call/Text Page:      [email or web address]      password [email or web address]  If 7PM-7AM, please contact night-coverage [email or web address] Password [email or web address] 05/10/2013, 12:59 PM   LOS: 2 days

## 2013-05-10 NOTE — Progress Notes (Signed)
Blood sugar was checked, 53 mg/dl.  Patient denies of any signs or symptoms of hypoglycemia.  Apple juice and crackers given.  Will rechecked blood sugar at 1700.

## 2013-05-11 LAB — BASIC METABOLIC PANEL
BUN: 12 mg/dL (ref 6–23)
Calcium: 9.1 mg/dL (ref 8.4–10.5)
Creatinine, Ser: 0.69 mg/dL (ref 0.50–1.35)
GFR calc Af Amer: >90 mL/min (ref >90)

## 2013-05-11 LAB — GLUCOSE, CAPILLARY: Glucose-Capillary: 147 mg/dL — ABNORMAL HIGH (ref 70–99)

## 2013-05-11 NOTE — Progress Notes (Signed)
Inpatient Diabetes Program Recommendations  AACE/ADA: New Consensus Statement on Inpatient Glycemic Control (2013)  Target Ranges:  Prepandial:   less than 140 mg/dL      Peak postprandial:   less than 180 mg/dL (1-2 hours)      Critically ill patients:  140 - 180 mg/dL  Results for LAMONT, TANT (MRN [id number]) as of 05/11/2013 10:31  Ref. Range 05/10/2013 12:21 05/10/2013 16:19 05/10/2013 17:57 05/10/2013 21:24 05/11/2013 08:09  Glucose-Capillary Latest Range: 70-99 mg/dL 308 (H) 53 (L) 657 (H) 219 (H) 147 (H)    Inpatient Diabetes Program Recommendations Correction (SSI): Decrease to SENSITIVE scale Thank you  Piedad Climes BSN, RN,CDE Inpatient Diabetes Coordinator [phone] (team pager)

## 2013-05-11 NOTE — Discharge Summary (Signed)
Physician Discharge Summary  [name] BJY:[id number] DOB: Apr 15, 1966 DOA: 05/08/2013  PCP: No primary provider on file.  Admit date: 05/08/2013 Discharge date: 05/11/2013  Time spent: 45 minutes  Recommendations for Outpatient Follow-up:  1. Check electrolytes in 1 wk- sodium, potassium and magnesium  Discharge Diagnoses:  Active Problems:   DM type 1 (diabetes mellitus, type 1)   Hyperglycemia   Seizures   Encephalopathy acute   Hyponatremia   Discharge Condition: stable  Diet recommendation: heart healthy and diabetic  Filed Weights   05/09/13 1428 05/10/13 0002 05/11/13 0005  Weight: 78.2 kg (172 lb 6.4 oz) 77.4 kg (170 lb 10.2 oz) 76.4 kg (168 lb 6.9 oz)    History of present illness:  47 yo with past medical history of DM type I brought to [facility] ED 6/10 with complaints of altered mental status. Family reported patient drinking lots of water in general and specifically on the day prior to admission while working outside. In ED: encephalopathic, obsered to have what appeared to be a partial seizure improved with ativan. Labs revealed severe hyponatremia. PCCM admitted the pt, and care was transitioned to the [facility] on 05/10/2013 as the patient proves to be stable with rapid improvement in his sodium while free water restricted   Hospital Course:  Hypervolemic Hyponatremia due to polydipsia  Resolved - Na normalized - urine osmolality was low, but not as low as would be consistent with an appropriate renal response, suggesting a possible dual cause of his hyponatremia, such as primary polydipsia with an element of SAIDH - he is net 6L negative fluid balance since admit - TSH and cortisol are both normal - Pt states he drank too much water due to cramping in his legs (thinking he was dehydrated) - he is advised to f/u with PCP in 1 wk for blood work   Partial seizures  CT head negative at admission - no recurrence of symptoms - felt to be due to hyponatremia    Acute encephalopathy in setting of hyponatremia  Mental status appears to have returned to baseline - CT head negative at admission   Leg Cramps May be related to hypokalemia or hypomagnesemia- again recommended that he have electrolytes checked in 1 wk.   Hypokalemia  Resolved   DM I  Resumed home meds  HTN  BP well controlled at present   HLD  Resume home medical therapy   Consultations:  none  Discharge Exam: Filed Vitals:   05/10/13 1957 05/11/13 0005 05/11/13 0444 05/11/13 0810  BP: 128/84 106/67 102/73 98/61  Pulse: 91 82 77 71  Temp: 98.1 F (36.7 C) 97.9 F (36.6 C) 98.4 F (36.9 C) 98.2 F (36.8 C)  TempSrc: Oral Axillary Oral Oral  Resp: 20 14 15 14   Height:      Weight:  76.4 kg (168 lb 6.9 oz)    SpO2: 100% 97% 99% 99%    General: AAo x 3 Cardiovascular: RRR, no murmurs Respiratory: CTA b/l   Discharge Instructions  Discharge Orders   Future Orders Complete By Expires     Diet - low sodium heart healthy  As directed     Comments:      And diabetic diet    Discharge instructions  As directed     Comments:      See your doctor in 1 week and have them check your sodium, potassium and magnesium levels.    Increase activity slowly  As directed  Medication List    TAKE these medications       atorvastatin 40 MG tablet  Commonly known as:  LIPITOR  Take 40 mg by mouth daily.     insulin glargine 100 UNIT/ML injection  Commonly known as:  LANTUS  Inject 16 Units into the skin at bedtime.     insulin lispro 100 UNIT/ML injection  Commonly known as:  HUMALOG  Inject 6-12 Units into the skin 3 (three) times daily before meals. Sliding scale     lisinopril 20 MG tablet  Commonly known as:  PRINIVIL,ZESTRIL  Take 10 mg by mouth daily.     pregabalin 150 MG capsule  Commonly known as:  LYRICA  Take 150 mg by mouth 2 (two) times daily.       No Known Allergies    The results of significant diagnostics from this  hospitalization (including imaging, microbiology, ancillary and laboratory) are listed below for reference.    Significant Diagnostic Studies: Ct Head Wo Contrast  05/08/2013   *RADIOLOGY REPORT*  Clinical Data: Vomiting, altered mental status  CT HEAD WITHOUT CONTRAST  Technique:  Contiguous axial images were obtained from the base of the skull through the vertex without contrast.  Comparison: None.  Findings: Atherosclerotic and physiologic intracranial calcifications.    There is no evidence of acute intracranial hemorrhage, brain edema, mass lesion, acute infarction,   mass effect, or midline shift. Acute infarct may be inapparent on noncontrast CT.  No other intra-axial abnormalities are seen, and the ventricles and sulci are within normal limits in size and symmetry.   No abnormal extra-axial fluid collections or masses are identified.  No significant calvarial abnormality.  IMPRESSION: 1. Negative for bleed or other acute intracranial process.   Original Report Authenticated By: D. Andria Rhein, MD   Dg Chest Port 1 View  05/10/2013   *RADIOLOGY REPORT*  Clinical Data: Atelectasis, shortness of breath.  PORTABLE CHEST - 1 VIEW  Comparison: 05/08/2013.  Findings: Trachea is normal.  Heart size normal. Improving aeration at the right lung base.  Lungs are otherwise clear.  No pleural fluid.  IMPRESSION: Improving aeration at the right lung base.   Original Report Authenticated By: Leanna Battles, M.D.   Dg Chest Port 1 View  05/08/2013   *RADIOLOGY REPORT*  Clinical Data: Chest pain, shortness of breath  PORTABLE CHEST - 1 VIEW  Comparison: 07/13/2010  Findings: Patchy interstitial opacities at the right lung base possibly early infiltrate.  Left lung clear.  No effusion.  Heart size normal.  Regional bones unremarkable.  IMPRESSION:  1.  Right lower lung interstitial opacities, possibly developing infiltrate.   Original Report Authenticated By: D. Andria Rhein, MD    Microbiology: Recent Results  (from the past 240 hour(s))  MRSA PCR SCREENING     Status: None   Collection Time    05/09/13  3:01 AM      Result Value Range Status   MRSA by PCR NEGATIVE  NEGATIVE Final   Comment:            The GeneXpert MRSA Assay (FDA     approved for NASAL specimens     only), is one component of a     comprehensive MRSA colonization     surveillance program. It is not     intended to diagnose MRSA     infection nor to guide or     monitor treatment for     MRSA infections.     Labs:  Basic Metabolic Panel:  Recent Labs Lab 05/09/13 0840 05/09/13 1428 05/09/13 1945 05/10/13 0233 05/11/13 0402  NA 134* 134* 137 139 138  K 4.1 4.7 4.3 4.0 4.2  CL 98 99 100 102 103  CO2 29 24 28 27 28   GLUCOSE 88 270* 323* 158* 152*  BUN 12 11 15 13 12   CREATININE 0.80 0.75 0.96 0.75 0.69  CALCIUM 8.1* 8.1* 8.7 8.6 9.1   Liver Function Tests:  Recent Labs Lab 05/08/13 2220  AST 35  ALT 33  ALKPHOS 84  BILITOT 1.2  PROT 6.6  ALBUMIN 4.0   No results found for this basename: LIPASE, AMYLASE,  in the last 168 hours  Recent Labs Lab 05/08/13 2138  AMMONIA 19   CBC:  Recent Labs Lab 05/08/13 2220 05/08/13 2234  WBC 7.8  --   NEUTROABS 5.4  --   HGB 13.3 14.3  HCT 37.0* 42.0  MCV 82.6  --   PLT 249  --    Cardiac Enzymes:  Recent Labs Lab 05/08/13 2220  TROPONINI <0.30   BNP: BNP (last 3 results) No results found for this basename: PROBNP,  in the last 8760 hours CBG:  Recent Labs Lab 05/10/13 1221 05/10/13 1619 05/10/13 1757 05/10/13 2124 05/11/13 0809  GLUCAP 325* 53* 107* 219* 147*       Signed:  Pepe Mineau  [facility] 05/11/2013, 10:38 AM

## 2013-08-21 ENCOUNTER — Ambulatory Visit (INDEPENDENT_AMBULATORY_CARE_PROVIDER_SITE_OTHER): Payer: Medicare Other | Admitting: Neurology

## 2013-08-21 ENCOUNTER — Ambulatory Visit (INDEPENDENT_AMBULATORY_CARE_PROVIDER_SITE_OTHER): Payer: Self-pay

## 2013-08-21 DIAGNOSIS — G63 Polyneuropathy in diseases classified elsewhere: Secondary | ICD-10-CM

## 2013-08-21 DIAGNOSIS — G5600 Carpal tunnel syndrome, unspecified upper limb: Secondary | ICD-10-CM

## 2013-08-21 DIAGNOSIS — Z0289 Encounter for other administrative examinations: Secondary | ICD-10-CM

## 2013-08-21 DIAGNOSIS — G629 Polyneuropathy, unspecified: Secondary | ICD-10-CM | POA: Insufficient documentation

## 2013-08-21 NOTE — Procedures (Signed)
    [facility]  NCS (NERVE CONDUCTION STUDY) WITH EMG (ELECTROMYOGRAPHY) REPORT   STUDY DATE:  08/21/2013 PATIENT NAME: [name] DOB: 1966/10/08 MRN: [id number]    TECHNOLOGIST: Judithann Sheen ELECTROMYOGRAPHER: Levert Feinstein M.D.  CLINICAL INFORMATION:  47 years old right-handed Caucasian male, with past medical history of diabetes, previous right BKA, recent hospitalization for hypokalemia, dehydration, reported a year history of frequent bilateral thigh muscle cramping  On examination, he has right BKA, length dependent sensory changes. Bilateral upper extremity motor and sensory examination was normal.  FINDINGS: NERVE CONDUCTION STUDY: Left peroneal sensory response was absent. Left peroneal to EDB, and tibial motor responses were absent.  Left median sensory response showed mildly prolonged peak latency, with normal snap amplitude. Left median motor response showed mildly prolonged distal latency, with normal C. map amplitude, conduction velocity.  Right ulnar sensory and motor responses were normal. Right median sensory response showed moderately prolonged peak latency, with mildly decreased snap amplitude. Right median motor response showed moderately prolonged distal latency, with normal C. map amplitude, conduction velocity, moderately prolonged F-wave latency.  NEEDLE ELECTROMYOGRAPHY: Selected needle examination was performed at bilateral lower extremity muscles, left lumbosacral paraspinal muscles.  Left tibialis anterior: increased insertion activity, 1 plus spontaneous activity, complex enlarged motor unit potential, with mildly decreased recruitment patterns. Left tibialis posterior : Increased insertion activity ,  2  plus spontaneous activity, enlarged complex motor unit potential with decreased recruitment patterns, Left vastus lateralis: increased insertion activity, no spontaneous activity, normal morphology motor unit potential,  with normal  recruitment patterns.  Right vastus lateralis: Increased insertion activity, no spontaneous activity normal morphology motor unit potential with normal recruitment patterns Bilateral femoris long head: Normal insertion activity, no spontaneous activity, normal morphology motor unit potential with normal recruitment patterns.  There was no spontaneous activity at left lumbosacral paraspinal muscles left L4-L5 , S1,      IMPRESSION:  This is an abnormal study. There is electrodiagnostic evidence of severe length dependent axonal peripheral neuropathy, there is also evidence of active denervations at distal leg muscles. There is no evidence of active lumbosacral radiculopathy, or myopathy.     Levert Feinstein M.D. Ph.D. [facility] [address] [place], [place] [address] [phone]

## 2013-09-13 ENCOUNTER — Other Ambulatory Visit: Payer: Self-pay

## 2013-09-14 ENCOUNTER — Other Ambulatory Visit: Payer: Self-pay | Admitting: *Deleted

## 2013-10-10 ENCOUNTER — Encounter: Payer: Self-pay | Admitting: Vascular Surgery

## 2013-10-11 ENCOUNTER — Ambulatory Visit (HOSPITAL_COMMUNITY)
Admission: RE | Admit: 2013-10-11 | Discharge: 2013-10-11 | Disposition: A | Discharge status: Home / Self Care | Payer: Medicare Other | Source: Ambulatory Visit | Attending: Vascular Surgery | Admitting: Vascular Surgery

## 2013-10-11 ENCOUNTER — Other Ambulatory Visit: Payer: Self-pay | Admitting: Vascular Surgery

## 2013-10-11 ENCOUNTER — Ambulatory Visit (INDEPENDENT_AMBULATORY_CARE_PROVIDER_SITE_OTHER): Payer: Medicare Other | Admitting: Vascular Surgery

## 2013-10-11 ENCOUNTER — Encounter: Payer: Self-pay | Admitting: Vascular Surgery

## 2013-10-11 VITALS — BP 117/75 | HR 95 | Ht 69.0 in | Wt 166.9 lb

## 2013-10-11 DIAGNOSIS — I739 Peripheral vascular disease, unspecified: Secondary | ICD-10-CM | POA: Insufficient documentation

## 2013-10-11 DIAGNOSIS — L97529 Non-pressure chronic ulcer of other part of left foot with unspecified severity: Secondary | ICD-10-CM

## 2013-10-11 DIAGNOSIS — L97509 Non-pressure chronic ulcer of other part of unspecified foot with unspecified severity: Secondary | ICD-10-CM

## 2013-10-11 DIAGNOSIS — E1169 Type 2 diabetes mellitus with other specified complication: Secondary | ICD-10-CM | POA: Insufficient documentation

## 2013-10-11 NOTE — Progress Notes (Signed)
[facility] [place]  Patient name: [name] MRN: [id number] DOB: May 10, 1966 Sex: male  REASON FOR CONSULT: evaluate pain left leg. Referred by Dr. Cleophas Dunker.  HPI: [name] is a 47 y.o. male who this summer was having problems with pain in his left leg while working area he describes cramping in the posterior aspect of his left thigh. Of note he has a right below-the-knee amputation and is ambulatory with his prosthesis. I do not get any clear-cut history of claudication, rest pain, or nonhealing ulcers.  He has had problems with hypokalemia in the past. He has also had problems with dehydration in the past.  I have reviewed his records from Dr. Cleophas Dunker is office. He did have an MRI on 08/24/2013 which shows some degenerative disc disease L4-L5 and L5-S1. He does have a history of neuropathy.   Past Medical History  Diagnosis Date  . Hypertension   . Diabetes mellitus without complication   . Hepatitis    Family History  Problem Relation Age of Onset  . Hyperlipidemia Mother   . Hyperlipidemia Brother    SOCIAL HISTORY: History  Substance Use Topics  . Smoking status: Never Smoker   . Smokeless tobacco: Current User    Types: Chew     Comment: chewing tobacco  . Alcohol Use: No   No Known Allergies Current Outpatient Prescriptions  Medication Sig Dispense Refill  . aspirin 81 MG tablet Take 81 mg by mouth daily.      Marland Kitchen atorvastatin (LIPITOR) 40 MG tablet Take 40 mg by mouth daily.      . insulin glargine (LANTUS) 100 UNIT/ML injection Inject 16 Units into the skin at bedtime.      . insulin lispro (HUMALOG) 100 UNIT/ML injection Inject 6-12 Units into the skin 3 (three) times daily before meals. Sliding scale      . lisinopril (PRINIVIL,ZESTRIL) 20 MG tablet Take 10 mg by mouth daily.      . pregabalin (LYRICA) 150 MG capsule Take 150 mg by mouth 2 (two) times daily.       No current facility-administered medications for this visit.    REVIEW OF SYSTEMS: Arly.Keller ] denotes positive finding; [  ] denotes negative finding  CARDIOVASCULAR:  [ ]  chest pain   [ ]  chest pressure   [ ]  palpitations   [ ]  orthopnea   [ ]  dyspnea on exertion   [ ]  claudication   [ ]  rest pain   [ ]  DVT   [ ]  phlebitis PULMONARY:   [ ]  productive cough   [ ]  asthma   [ ]  wheezing NEUROLOGIC:   [ ]  weakness  [ ]  paresthesias  [ ]  aphasia  [ ]  amaurosis  [ ]  dizziness HEMATOLOGIC:   [ ]  bleeding problems   [ ]  clotting disorders MUSCULOSKELETAL:  [ ]  joint pain   [ ]  joint swelling [ ]  leg swelling GASTROINTESTINAL: [ ]   blood in stool  [ ]   hematemesis GENITOURINARY:  [ ]   dysuria  [ ]   hematuria PSYCHIATRIC:  [ ]  history of major depression INTEGUMENTARY:  [ ]  rashes  [ ]  ulcers CONSTITUTIONAL:  [ ]  fever   [ ]  chills  PHYSICAL EXAM: Filed Vitals:   10/11/13 1311  BP: 117/75  Pulse: 95  Height: 5\' 9"  (1.753 m)  Weight: 166 lb 14.4 oz (75.705 kg)  SpO2: 100%   Body mass index is 24.64 kg/(m^2). GENERAL: The patient is a well-nourished  male, in no acute distress. The vital signs are documented above. CARDIOVASCULAR: There is a regular rate and rhythm. I do not detect carotid bruits. He has palpable femoral pulses. I cannot palpate a popliteal pulse on the left or pedal pulses on the left. PULMONARY: There is good air exchange bilaterally without wheezing or rales. ABDOMEN: Soft and non-tender with normal pitched bowel sounds.  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 His foot appears adequately perfused. NEUROLOGIC: No focal weakness or paresthesias are detected. SKIN: There are no ulcers or rashes noted. PSYCHIATRIC: The patient has a normal affect.  DATA:  I have independently interpreted his arterial Doppler study. He has triphasic Doppler signals in the left posterior tibial and dorsalis pedis positions. ABIs could not be obtained because his arteries are calcified. However, this toe pressure on the left is 91 mmHg which is  within normal limits.  MEDICAL ISSUES: LEFT LEG PAIN AND CRAMPING: I reassured the patient that I did not think his leg cramps were related to arterial insufficiency. His arterial Doppler study showed excellent perfusion. I do not think any further vascular workup is indicated at this point. He has had problems with dehydration and hypokalemia in the past and he may simply have had cramps related to this this summer. Of note he has not had any recent cramping in the left leg. I'll be happy to see him back at any time if any new vascular issues arise.  DICKSON,CHRISTOPHER S [facility] [place] Beeper: [phone]

## 2014-06-30 ENCOUNTER — Encounter: Payer: Self-pay | Admitting: *Deleted

## 2016-08-31 ENCOUNTER — Ambulatory Visit (INDEPENDENT_AMBULATORY_CARE_PROVIDER_SITE_OTHER): Payer: Self-pay | Admitting: Orthopaedic Surgery

## 2016-09-01 ENCOUNTER — Ambulatory Visit (INDEPENDENT_AMBULATORY_CARE_PROVIDER_SITE_OTHER): Payer: Medicare Other | Admitting: Orthopaedic Surgery

## 2016-09-01 DIAGNOSIS — M25572 Pain in left ankle and joints of left foot: Secondary | ICD-10-CM | POA: Diagnosis not present

## 2016-09-01 DIAGNOSIS — E11621 Type 2 diabetes mellitus with foot ulcer: Secondary | ICD-10-CM | POA: Diagnosis not present

## 2016-09-01 DIAGNOSIS — L97529 Non-pressure chronic ulcer of other part of left foot with unspecified severity: Secondary | ICD-10-CM | POA: Diagnosis not present

## 2016-09-08 ENCOUNTER — Ambulatory Visit (INDEPENDENT_AMBULATORY_CARE_PROVIDER_SITE_OTHER): Payer: Medicare Other | Admitting: Orthopaedic Surgery

## 2016-09-08 DIAGNOSIS — M25572 Pain in left ankle and joints of left foot: Secondary | ICD-10-CM

## 2016-09-08 DIAGNOSIS — L97529 Non-pressure chronic ulcer of other part of left foot with unspecified severity: Secondary | ICD-10-CM | POA: Diagnosis not present

## 2016-09-08 DIAGNOSIS — E11621 Type 2 diabetes mellitus with foot ulcer: Secondary | ICD-10-CM | POA: Diagnosis not present

## 2016-09-16 ENCOUNTER — Ambulatory Visit (INDEPENDENT_AMBULATORY_CARE_PROVIDER_SITE_OTHER): Payer: Medicare Other | Admitting: Orthopedic Surgery

## 2016-09-16 DIAGNOSIS — L97529 Non-pressure chronic ulcer of other part of left foot with unspecified severity: Secondary | ICD-10-CM | POA: Diagnosis not present

## 2016-09-18 ENCOUNTER — Other Ambulatory Visit: Payer: Self-pay | Admitting: Pediatrics

## 2016-09-18 ENCOUNTER — Other Ambulatory Visit (INDEPENDENT_AMBULATORY_CARE_PROVIDER_SITE_OTHER): Payer: Self-pay | Admitting: Orthopaedic Surgery

## 2016-09-18 DIAGNOSIS — M79672 Pain in left foot: Secondary | ICD-10-CM

## 2016-09-24 ENCOUNTER — Ambulatory Visit (INDEPENDENT_AMBULATORY_CARE_PROVIDER_SITE_OTHER): Payer: Medicare Other | Admitting: Orthopedic Surgery

## 2016-09-30 ENCOUNTER — Ambulatory Visit
Admission: RE | Admit: 2016-09-30 | Discharge: 2016-09-30 | Disposition: A | Discharge status: Home / Self Care | Payer: Medicare Other | Source: Ambulatory Visit | Attending: Orthopaedic Surgery | Admitting: Orthopaedic Surgery

## 2016-09-30 DIAGNOSIS — M79672 Pain in left foot: Secondary | ICD-10-CM

## 2016-10-07 ENCOUNTER — Ambulatory Visit (INDEPENDENT_AMBULATORY_CARE_PROVIDER_SITE_OTHER): Payer: Medicare Other | Admitting: Orthopedic Surgery

## 2016-10-13 ENCOUNTER — Ambulatory Visit (INDEPENDENT_AMBULATORY_CARE_PROVIDER_SITE_OTHER): Payer: Medicare Other | Admitting: Orthopaedic Surgery

## 2016-10-13 ENCOUNTER — Encounter (INDEPENDENT_AMBULATORY_CARE_PROVIDER_SITE_OTHER): Payer: Self-pay | Admitting: Orthopaedic Surgery

## 2016-10-13 VITALS — BP 112/77 | HR 94 | Resp 14 | Ht 68.0 in | Wt 163.0 lb

## 2016-10-13 DIAGNOSIS — L97521 Non-pressure chronic ulcer of other part of left foot limited to breakdown of skin: Secondary | ICD-10-CM | POA: Diagnosis not present

## 2016-10-13 NOTE — Progress Notes (Signed)
   Office Visit Note   Patient: Samuel MannersSteven M Leask           Date of Birth: May 22, 1966           MRN: [id number] Visit Date: 10/13/2016              Requested by: Kaleen MaskWilson Oliver Elkins, MD [address] [place], [place] [id number] PCP: Kaleen MaskELKINS,WILSON OLIVER, MD   Assessment & Plan: Visit Diagnoses: No diagnosis found.  Plan: [facility] will [facility] The MRI scan demonstrates no evidence of osteomyelitis. The ulcer appears to be healing and quite superficial but he's had  some ulceration for well over a month.  Follow-Up Instructions: No Follow-up on file.   Orders:  No orders of the defined types were placed in this encounter.  No orders of the defined types were placed in this encounter.     Procedures: No procedures performed   Clinical Data: No additional findings.   Subjective: No chief complaint on file.   MRI results of Left foot. Pt states it looks much better than it has in the past.open area seems to be smaller. 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   HENT: Negative.   Eyes: Negative.   Respiratory: Negative.   Cardiovascular: Negative.   Gastrointestinal: Negative.   Endocrine: Negative.   Genitourinary: Negative.   Musculoskeletal: Negative.   Skin: Negative.   Allergic/Immunologic: Negative.   Neurological: Negative.   Hematological: Negative.   Psychiatric/Behavioral: Negative.      Objective: Vital Signs: There were no vitals taken for this visit.  Physical Exam  Ortho Exam exam of the left foot demonstrates a persistent ulcer. The fourth or fifth metatarsal head. The ulcer appears to be clean and superficial. There is no exposed tendon or bone. There is no odor or evidence of purulence. No erythema about the foot or evidence of ascending lymphangitis. Specialty Comments:  No specialty comments available.  Imaging: No  results found.   PMFS History: Patient Active Problem List   Diagnosis Date Noted  . Peripheral vascular disease, unspecified 10/11/2013  . Peripheral neuropathy (HCC) 08/21/2013  . DM type 1 (diabetes mellitus, type 1) (HCC) 05/09/2013  . Hyperglycemia 05/09/2013  . Seizures (HCC) 05/09/2013  . Encephalopathy acute 05/09/2013  . Hyponatremia 05/09/2013   Past Medical History:  Diagnosis Date  . Diabetes mellitus without complication   . Hepatitis   . Hypertension     Family History  Problem Relation Age of Onset  . Hyperlipidemia Mother   . Hyperlipidemia Brother     Past Surgical History:  Procedure Laterality Date  . BACK SURGERY     cervical  . FOOT SURGERY Left   . LEG AMPUTATION Right    BKA   Social History   Occupational History  . Not on file.   Social History Main Topics  . Smoking status: Never Smoker  . Smokeless tobacco: Current User    Types: Chew     Comment: chewing tobacco  . Alcohol use Yes     Comment: 6 pack daily  . 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 Sexual activity: Not on file

## 2016-11-03 ENCOUNTER — Encounter (HOSPITAL_BASED_OUTPATIENT_CLINIC_OR_DEPARTMENT_OTHER): Payer: Medicare Other | Attending: Surgery

## 2016-11-03 DIAGNOSIS — L84 Corns and callosities: Secondary | ICD-10-CM | POA: Diagnosis not present

## 2016-11-03 DIAGNOSIS — Z79899 Other long term (current) drug therapy: Secondary | ICD-10-CM | POA: Diagnosis not present

## 2016-11-03 DIAGNOSIS — E10621 Type 1 diabetes mellitus with foot ulcer: Secondary | ICD-10-CM | POA: Diagnosis not present

## 2016-11-03 DIAGNOSIS — Z7982 Long term (current) use of aspirin: Secondary | ICD-10-CM | POA: Insufficient documentation

## 2016-11-03 DIAGNOSIS — L97421 Non-pressure chronic ulcer of left heel and midfoot limited to breakdown of skin: Secondary | ICD-10-CM | POA: Insufficient documentation

## 2016-11-03 DIAGNOSIS — Z794 Long term (current) use of insulin: Secondary | ICD-10-CM | POA: Insufficient documentation

## 2016-11-03 DIAGNOSIS — E1065 Type 1 diabetes mellitus with hyperglycemia: Secondary | ICD-10-CM | POA: Insufficient documentation

## 2016-11-03 DIAGNOSIS — F17220 Nicotine dependence, chewing tobacco, uncomplicated: Secondary | ICD-10-CM | POA: Diagnosis not present

## 2016-11-03 DIAGNOSIS — Z89511 Acquired absence of right leg below knee: Secondary | ICD-10-CM | POA: Diagnosis not present

## 2016-11-03 DIAGNOSIS — E1042 Type 1 diabetes mellitus with diabetic polyneuropathy: Secondary | ICD-10-CM | POA: Insufficient documentation

## 2016-11-03 DIAGNOSIS — E782 Mixed hyperlipidemia: Secondary | ICD-10-CM | POA: Diagnosis not present

## 2016-11-10 DIAGNOSIS — E10621 Type 1 diabetes mellitus with foot ulcer: Secondary | ICD-10-CM | POA: Diagnosis not present

## 2016-11-20 ENCOUNTER — Ambulatory Visit (HOSPITAL_COMMUNITY): Admission: RE | Admit: 2016-11-20 | Payer: Medicare Other | Source: Ambulatory Visit

## 2016-11-20 ENCOUNTER — Ambulatory Visit (HOSPITAL_COMMUNITY)
Admission: RE | Admit: 2016-11-20 | Discharge: 2016-11-20 | Disposition: A | Discharge status: Home / Self Care | Payer: Medicare Other | Source: Ambulatory Visit | Attending: Vascular Surgery | Admitting: Vascular Surgery

## 2016-11-20 ENCOUNTER — Other Ambulatory Visit: Payer: Self-pay | Admitting: Surgery

## 2016-11-20 DIAGNOSIS — L97309 Non-pressure chronic ulcer of unspecified ankle with unspecified severity: Secondary | ICD-10-CM | POA: Diagnosis present

## 2016-11-24 DIAGNOSIS — E10621 Type 1 diabetes mellitus with foot ulcer: Secondary | ICD-10-CM | POA: Diagnosis not present

## 2016-11-27 ENCOUNTER — Ambulatory Visit (INDEPENDENT_AMBULATORY_CARE_PROVIDER_SITE_OTHER): Payer: Medicare Other | Admitting: Orthopaedic Surgery

## 2016-11-27 DIAGNOSIS — L97521 Non-pressure chronic ulcer of other part of left foot limited to breakdown of skin: Secondary | ICD-10-CM

## 2016-11-27 NOTE — Progress Notes (Signed)
   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 Fortunately the MRI scan did not reveal any bone destruction or evidence of osteomyelitis. [facility] and he's had significant diminution in the size of the ulcer.  Plan: Follow up as needed. Finish his evaluation at the wound clinic. Return to bio tack for reevaluation of the left shoe insert to relieve pressure over the metatarsal heads  Follow-Up Instructions: No Follow-up on file.   Orders:  No orders of the defined types were placed in this encounter.  No orders of the defined types were placed in this encounter.     Procedures: No procedures performed   Clinical Data: No additional findings.   Subjective: No chief complaint on file.   Pt is seeing Dr. Allena KatzPatel and he keeps cutting away the callus skin.    No related pain and swelling or erythema in the left foot. Review of Systems   Objective: Vital Signs: There were no vitals taken for this visit.  Physical Exam  Ortho Exam left foot exam reveals the ulcer to be completely epithelialized there is no redness. There is excellent capillary refill to the toes. There is no drainage.  Specialty Comments:  No specialty comments available.  Imaging: No results found.   PMFS History: Patient Active Problem List   Diagnosis Date Noted  . Peripheral vascular disease, unspecified 10/11/2013  . Peripheral neuropathy (HCC) 08/21/2013  . DM type 1 (diabetes mellitus, type 1) (HCC) 05/09/2013  . Hyperglycemia 05/09/2013  . Seizures (HCC) 05/09/2013  . Encephalopathy acute 05/09/2013  .  Hyponatremia 05/09/2013   Past Medical History:  Diagnosis Date  . Diabetes mellitus without complication (HCC)   . Hepatitis   . Hypertension     Family History  Problem Relation Age of Onset  . Hyperlipidemia Mother   . Hyperlipidemia Brother     Past Surgical History:  Procedure Laterality Date  . BACK SURGERY     cervical  . FOOT SURGERY Left   . LEG AMPUTATION Right    BKA   Social History   Occupational History  . Not on file.   Social History Main Topics  . Smoking status: Never Smoker  . Smokeless tobacco: Current User    Types: Chew     Comment: chewing tobacco  . Alcohol use Yes     Comment: 6 pack daily  . 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 Sexual activity: Not on file

## 2016-12-01 ENCOUNTER — Encounter (HOSPITAL_BASED_OUTPATIENT_CLINIC_OR_DEPARTMENT_OTHER): Payer: Medicare Other | Attending: Surgery

## 2016-12-01 DIAGNOSIS — E1042 Type 1 diabetes mellitus with diabetic polyneuropathy: Secondary | ICD-10-CM | POA: Diagnosis not present

## 2016-12-01 DIAGNOSIS — L84 Corns and callosities: Secondary | ICD-10-CM | POA: Diagnosis not present

## 2016-12-01 DIAGNOSIS — L97421 Non-pressure chronic ulcer of left heel and midfoot limited to breakdown of skin: Secondary | ICD-10-CM | POA: Insufficient documentation

## 2016-12-01 DIAGNOSIS — Z89511 Acquired absence of right leg below knee: Secondary | ICD-10-CM | POA: Insufficient documentation

## 2016-12-01 DIAGNOSIS — F17220 Nicotine dependence, chewing tobacco, uncomplicated: Secondary | ICD-10-CM | POA: Insufficient documentation

## 2016-12-01 DIAGNOSIS — E10621 Type 1 diabetes mellitus with foot ulcer: Secondary | ICD-10-CM | POA: Diagnosis present

## 2016-12-08 DIAGNOSIS — E10621 Type 1 diabetes mellitus with foot ulcer: Secondary | ICD-10-CM | POA: Diagnosis not present

## 2016-12-15 DIAGNOSIS — E10621 Type 1 diabetes mellitus with foot ulcer: Secondary | ICD-10-CM | POA: Diagnosis not present

## 2017-04-02 IMAGING — MR MR FOOT*L* W/O CM
4 of 5 series · 24 of 40 positions shown · non-contrast
Comparison: MRI 02/09/2012

CLINICAL DATA: Nonhealing ulcer involving the lateral plantar
aspect of the forefoot for 1.5 months. History of diabetes and right
leg amputation.

EXAM:
MRI OF THE LEFT FOOT WITHOUT CONTRAST
TECHNIQUE: Multiplanar, multisequence MR imaging was performed. No intravenous
contrast was administered.

[Series 3: T2 fat-sat · coronal · 4.0mm · 0.23mm/px · 9 of 27 slices shown]
[im 1/27]
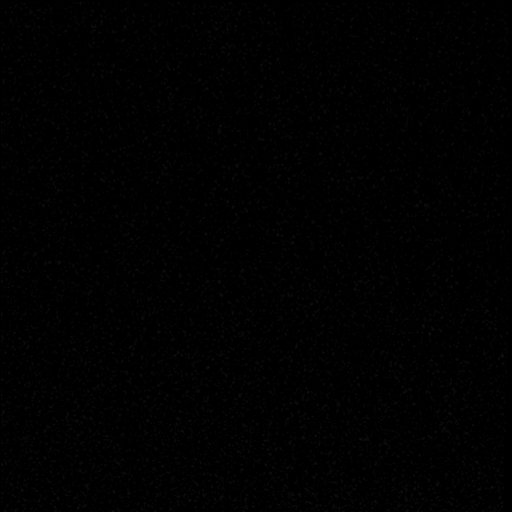
[im 4/27]
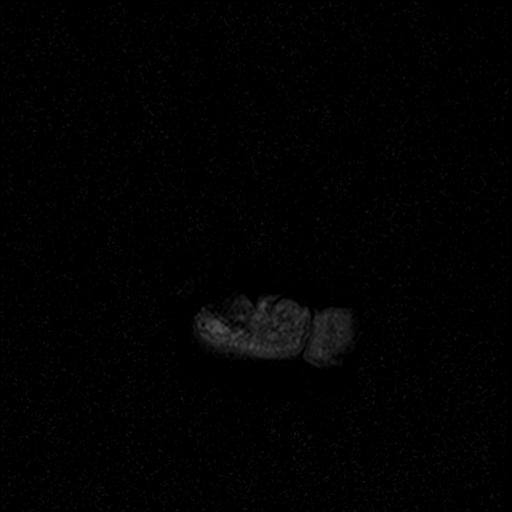
[im 7/27]
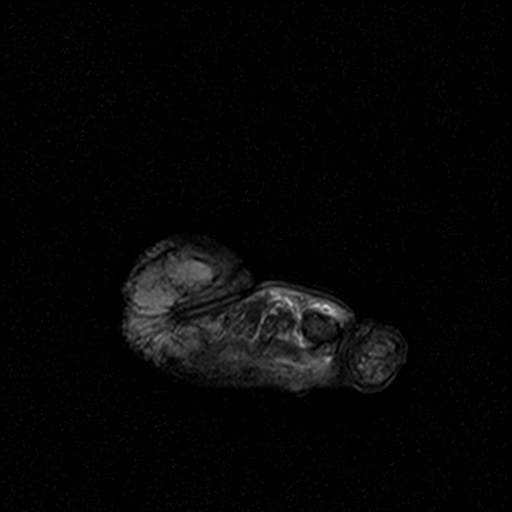
[im 10/27]
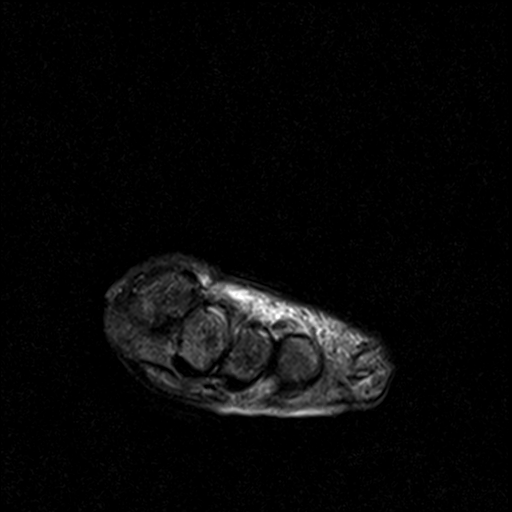
[im 14/27]
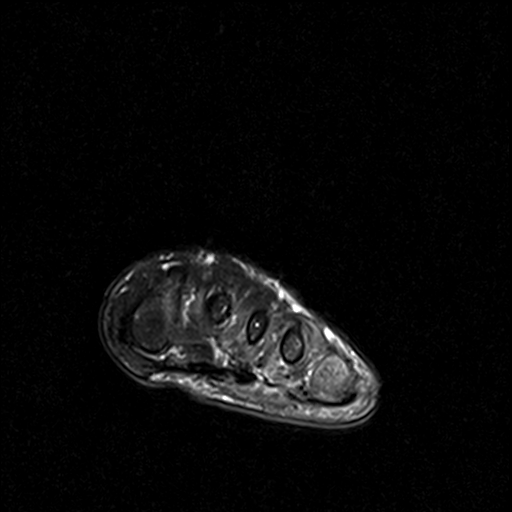
[im 17/27]
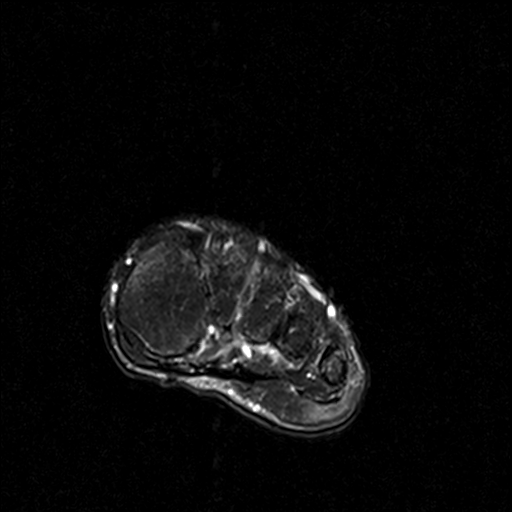
[im 20/27]
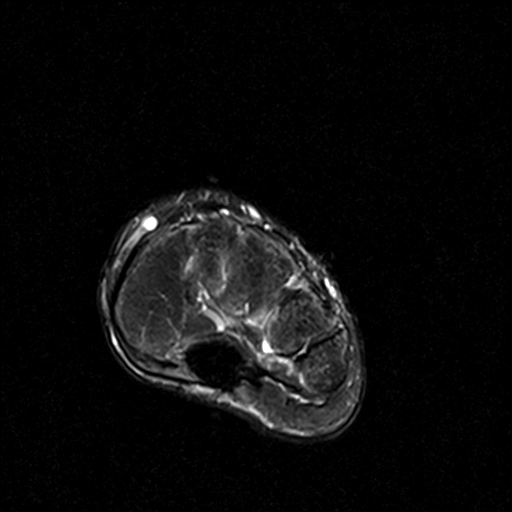
[im 23/27]
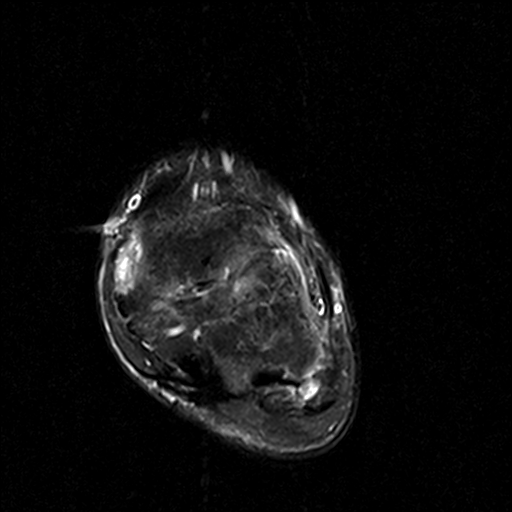
[im 27/27]
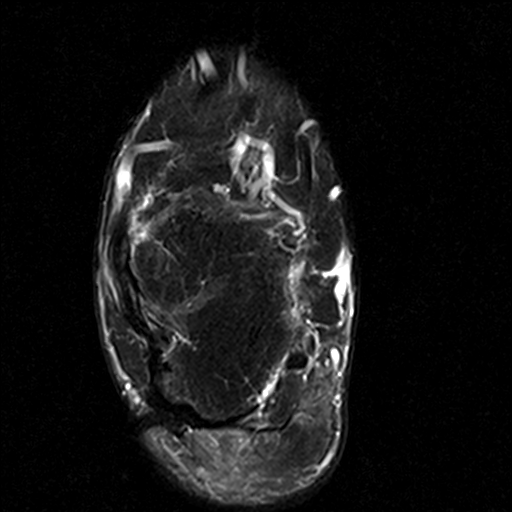

[Series 4: T1 · coronal · 4.0mm · 0.38mm/px · 9 of 27 slices shown (1 of 2)]
[im 1/27]
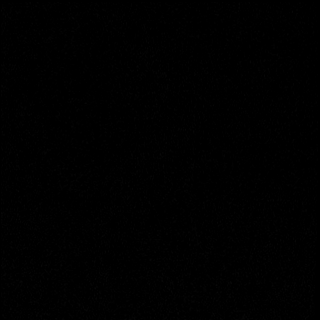
[im 4/27]
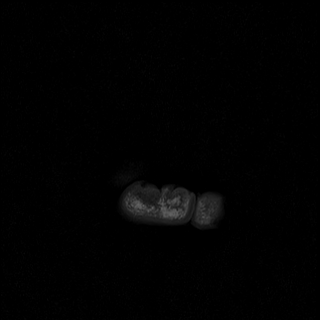
[im 7/27]
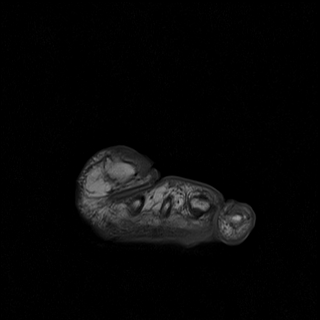
[im 10/27]
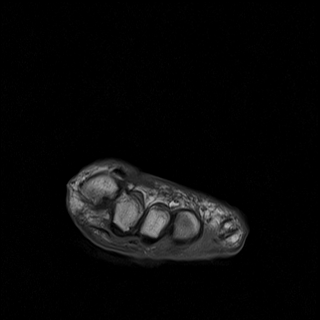
[im 14/27]
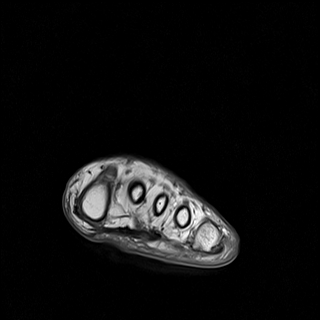
[im 17/27]
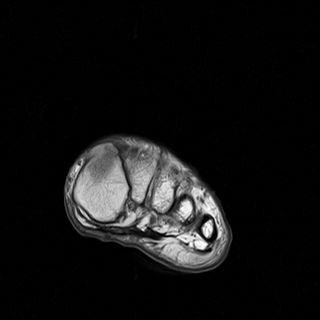
[im 20/27]
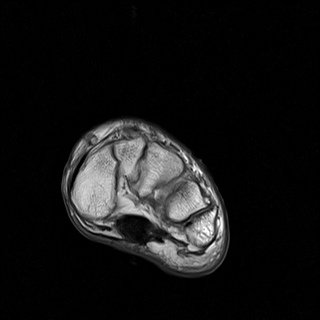
[im 23/27]
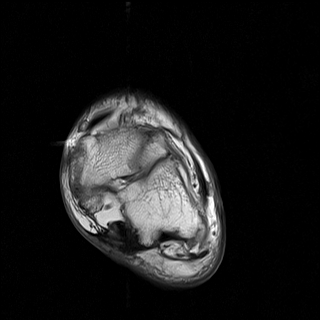
[im 27/27]
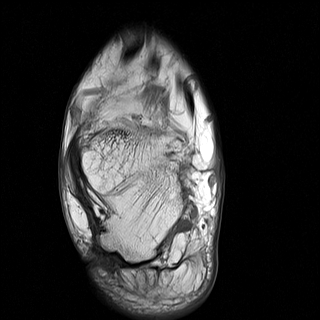

[Series 5: T1 · axial · 2.5mm · 0.28mm/px · z∈[-24,+11]mm · 3 of 23 slices shown (2 of 2)]
[im 4/23]
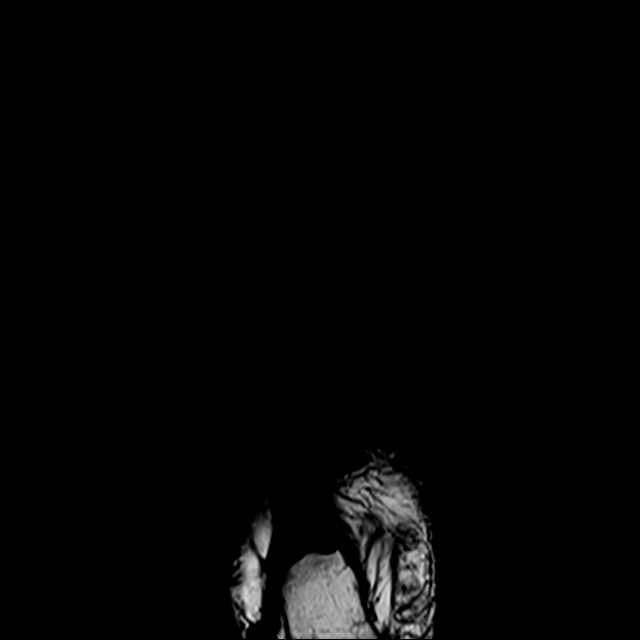
[im 13/23]
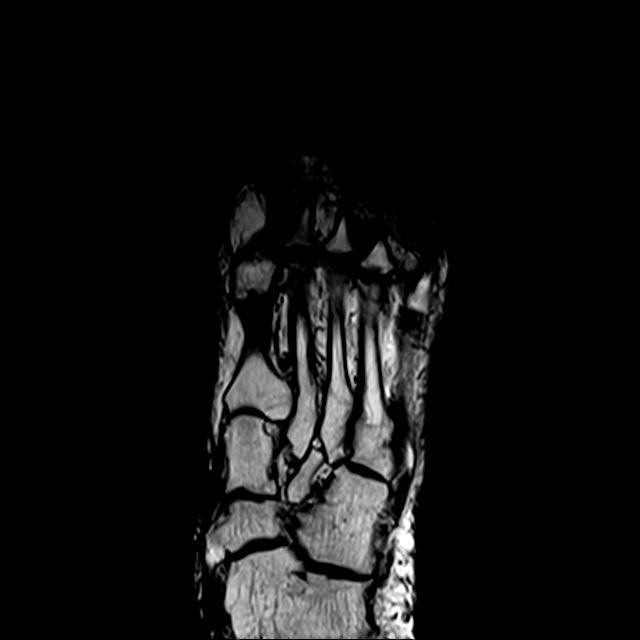
[im 19/23]
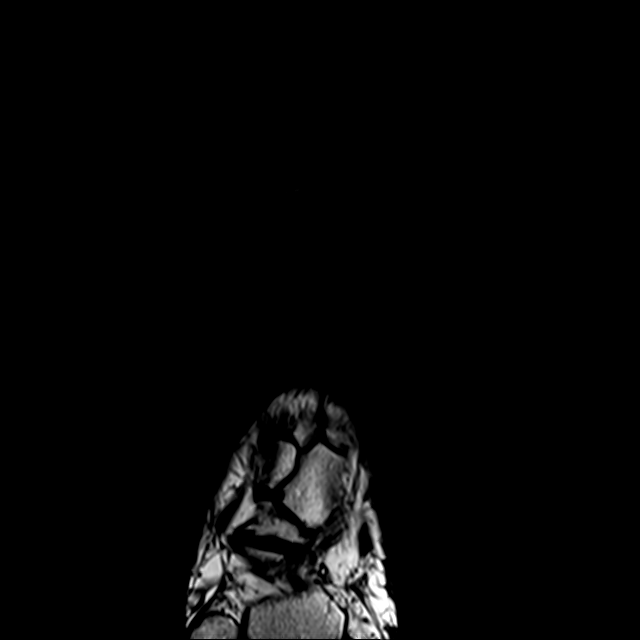

[Series 6: STIR · axial · 2.5mm · 0.70mm/px · z∈[-25,+12]mm · 3 of 23 slices shown]
[im 4/23]
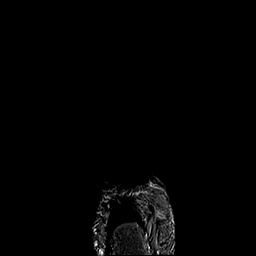
[im 13/23]
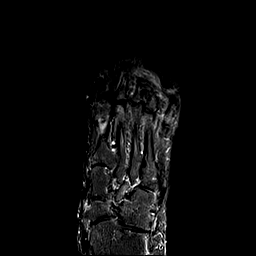
[im 19/23]
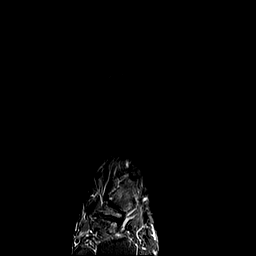

[24 of 40 positions shown; findings below may reference images not displayed]

FINDINGS: Bones/Joint/Cartilage

No evidence of acute fracture, dislocation or acute bone
destruction. There are chronic deformities of the great toe
phalanges which appear fused. There are chronic degenerative changes
at the first metatarsal phalangeal joint and chronic deformity of
the first metatarsal head. The second distal phalanx is not well
visualized. The alignment is normal at the Lisfranc joint. There is
postsurgical susceptibility artifact medial to the navicular.
Subtalar ankylosis noted.

Ligaments

The Lisfranc ligament is intact.

Muscles and Tendons
Generalized forefoot muscular atrophy. There is chronic thickening
of the flexor tendons in the midfoot. No significant tenosynovitis.

Soft tissues
Chronic skin irregularity and underlying decreased signal in the
subcutaneous fat in the ball of the foot. No underlying focal fluid
collection. There is possible additional soft tissue ulceration in
the plantar aspect of the hindfoot (best seen on image 3 of series
4).
IMPRESSION: 1. Areas of chronic soft tissue ulceration in the plantar foot,
similar to previous study. No evidence of soft tissue abscess.
2. No evidence of osteomyelitis.
3. Chronic deformities in the first ray.

## 2017-05-06 ENCOUNTER — Inpatient Hospital Stay (HOSPITAL_COMMUNITY)
Admission: EM | Admit: 2017-05-06 | Discharge: 2017-05-07 | DRG: 683 | Disposition: A | Discharge status: Home / Self Care | Payer: Medicare Other | Attending: Internal Medicine | Admitting: Internal Medicine

## 2017-05-06 ENCOUNTER — Encounter (HOSPITAL_COMMUNITY): Payer: Self-pay | Admitting: Emergency Medicine

## 2017-05-06 DIAGNOSIS — N179 Acute kidney failure, unspecified: Principal | ICD-10-CM | POA: Diagnosis present

## 2017-05-06 DIAGNOSIS — I10 Essential (primary) hypertension: Secondary | ICD-10-CM | POA: Diagnosis present

## 2017-05-06 DIAGNOSIS — I959 Hypotension, unspecified: Secondary | ICD-10-CM | POA: Diagnosis present

## 2017-05-06 DIAGNOSIS — Z7982 Long term (current) use of aspirin: Secondary | ICD-10-CM

## 2017-05-06 DIAGNOSIS — Z87891 Personal history of nicotine dependence: Secondary | ICD-10-CM

## 2017-05-06 DIAGNOSIS — Z79899 Other long term (current) drug therapy: Secondary | ICD-10-CM

## 2017-05-06 DIAGNOSIS — E1051 Type 1 diabetes mellitus with diabetic peripheral angiopathy without gangrene: Secondary | ICD-10-CM | POA: Diagnosis present

## 2017-05-06 DIAGNOSIS — E860 Dehydration: Secondary | ICD-10-CM | POA: Diagnosis present

## 2017-05-06 DIAGNOSIS — M6282 Rhabdomyolysis: Secondary | ICD-10-CM | POA: Diagnosis present

## 2017-05-06 DIAGNOSIS — E785 Hyperlipidemia, unspecified: Secondary | ICD-10-CM | POA: Diagnosis present

## 2017-05-06 DIAGNOSIS — I739 Peripheral vascular disease, unspecified: Secondary | ICD-10-CM | POA: Diagnosis present

## 2017-05-06 DIAGNOSIS — Z89511 Acquired absence of right leg below knee: Secondary | ICD-10-CM

## 2017-05-06 DIAGNOSIS — E109 Type 1 diabetes mellitus without complications: Secondary | ICD-10-CM | POA: Diagnosis present

## 2017-05-06 LAB — SODIUM, URINE, RANDOM
SODIUM UR: 49 mmol/L
Sodium, Ur: 48 mmol/L

## 2017-05-06 LAB — COMPREHENSIVE METABOLIC PANEL
ALBUMIN: 3.5 g/dL (ref 3.5–5.0)
ALT: 26 U/L (ref 17–63)
ALT: 27 U/L (ref 17–63)
AST: 32 U/L (ref 15–41)
AST: 38 U/L (ref 15–41)
Albumin: 4.2 g/dL (ref 3.5–5.0)
Alkaline Phosphatase: 71 U/L (ref 38–126)
Alkaline Phosphatase: 63 U/L (ref 38–126)
Anion gap: 12 (ref 5–15)
Anion gap: 11 (ref 5–15)
BUN: 21 mg/dL — ABNORMAL HIGH (ref 6–20)
BUN: 21 mg/dL — AB (ref 6–20)
CALCIUM: 9.3 mg/dL (ref 8.9–10.3)
CHLORIDE: 102 mmol/L (ref 101–111)
CHLORIDE: 106 mmol/L (ref 101–111)
CO2: 24 mmol/L (ref 22–32)
CO2: 22 mmol/L (ref 22–32)
CREATININE: 1.82 mg/dL — AB (ref 0.61–1.24)
Calcium: 8.4 mg/dL — ABNORMAL LOW (ref 8.9–10.3)
Creatinine, Ser: 2.67 mg/dL — ABNORMAL HIGH (ref 0.61–1.24)
GFR calc Af Amer: 49 mL/min — ABNORMAL LOW (ref >60)
GFR calc non Af Amer: 26 mL/min — ABNORMAL LOW (ref >60)
GFR calc non Af Amer: 42 mL/min — ABNORMAL LOW (ref >60)
GFR, EST AFRICAN AMERICAN: 31 mL/min — AB (ref >60)
GLUCOSE: 236 mg/dL — AB (ref 65–99)
Glucose, Bld: 175 mg/dL — ABNORMAL HIGH (ref 65–99)
Potassium: 3.3 mmol/L — ABNORMAL LOW (ref 3.5–5.1)
Potassium: 3.8 mmol/L (ref 3.5–5.1)
SODIUM: 138 mmol/L (ref 135–145)
Sodium: 139 mmol/L (ref 135–145)
Total Bilirubin: 1.1 mg/dL (ref 0.3–1.2)
Total Bilirubin: 0.8 mg/dL (ref 0.3–1.2)
Total Protein: 6.9 g/dL (ref 6.5–8.1)
Total Protein: 5.7 g/dL — ABNORMAL LOW (ref 6.5–8.1)

## 2017-05-06 LAB — CBG MONITORING, ED
GLUCOSE-CAPILLARY: 233 mg/dL — AB (ref 65–99)
Glucose-Capillary: 169 mg/dL — ABNORMAL HIGH (ref 65–99)
Glucose-Capillary: 373 mg/dL — ABNORMAL HIGH (ref 65–99)

## 2017-05-06 LAB — CBC WITH DIFFERENTIAL/PLATELET
Basophils Absolute: 0 10*3/uL (ref 0.0–0.1)
Basophils Relative: 0 %
EOS ABS: 0.1 10*3/uL (ref 0.0–0.7)
EOS PCT: 0 %
HCT: 40.8 % (ref 39.0–52.0)
Hemoglobin: 14.8 g/dL (ref 13.0–17.0)
LYMPHS ABS: 1 10*3/uL (ref 0.7–4.0)
Lymphocytes Relative: 8 %
MCH: 31 pg (ref 26.0–34.0)
MCHC: 36.3 g/dL — AB (ref 30.0–36.0)
MCV: 85.4 fL (ref 78.0–100.0)
MONOS PCT: 6 %
Monocytes Absolute: 0.7 10*3/uL (ref 0.1–1.0)
Neutro Abs: 10.8 10*3/uL — ABNORMAL HIGH (ref 1.7–7.7)
Neutrophils Relative %: 85 %
PLATELETS: 309 10*3/uL (ref 150–400)
RBC: 4.78 MIL/uL (ref 4.22–5.81)
RDW: 12.4 % (ref 11.5–15.5)
WBC: 12.7 10*3/uL — ABNORMAL HIGH (ref 4.0–10.5)

## 2017-05-06 LAB — URINALYSIS, ROUTINE W REFLEX MICROSCOPIC
BILIRUBIN URINE: NEGATIVE
Bacteria, UA: NONE SEEN
Glucose, UA: >=500 mg/dL — AB
Ketones, ur: 5 mg/dL — AB
LEUKOCYTES UA: NEGATIVE
Nitrite: NEGATIVE
PH: 5 (ref 5.0–8.0)
Protein, ur: 30 mg/dL — AB
Specific Gravity, Urine: 1.025 (ref 1.005–1.030)

## 2017-05-06 LAB — CBC
HCT: 37.5 % — ABNORMAL LOW (ref 39.0–52.0)
HEMOGLOBIN: 13.1 g/dL (ref 13.0–17.0)
MCH: 29.8 pg (ref 26.0–34.0)
MCHC: 34.9 g/dL (ref 30.0–36.0)
MCV: 85.2 fL (ref 78.0–100.0)
Platelets: 302 10*3/uL (ref 150–400)
RBC: 4.4 MIL/uL (ref 4.22–5.81)
RDW: 12.5 % (ref 11.5–15.5)
WBC: 12.4 10*3/uL — AB (ref 4.0–10.5)

## 2017-05-06 LAB — RAPID URINE DRUG SCREEN, HOSP PERFORMED
AMPHETAMINES: NOT DETECTED
Barbiturates: NOT DETECTED
Benzodiazepines: NOT DETECTED
COCAINE: NOT DETECTED
Opiates: NOT DETECTED
TETRAHYDROCANNABINOL: POSITIVE — AB

## 2017-05-06 LAB — CK
CK TOTAL: 1794 U/L — AB (ref 49–397)
Total CK: 858 U/L — ABNORMAL HIGH (ref 49–397)

## 2017-05-06 LAB — GLUCOSE, CAPILLARY
Glucose-Capillary: 174 mg/dL — ABNORMAL HIGH (ref 65–99)
Glucose-Capillary: 164 mg/dL — ABNORMAL HIGH (ref 65–99)

## 2017-05-06 LAB — HIV ANTIBODY (ROUTINE TESTING W REFLEX): HIV Screen 4th Generation wRfx: NONREACTIVE

## 2017-05-06 LAB — MAGNESIUM: MAGNESIUM: 2.2 mg/dL (ref 1.7–2.4)

## 2017-05-06 LAB — MRSA PCR SCREENING: MRSA by PCR: NEGATIVE

## 2017-05-06 LAB — TROPONIN I: Troponin I: <0.03 ng/mL (ref <0.03)

## 2017-05-06 LAB — TSH: TSH: 1.285 u[IU]/mL (ref 0.350–4.500)

## 2017-05-06 LAB — CREATININE, URINE, RANDOM: Creatinine, Urine: 179.46 mg/dL

## 2017-05-06 MED ORDER — ACETAMINOPHEN 650 MG RE SUPP: 650.0000 mg | Freq: Four times a day (QID) | RECTAL | Status: DC | PRN

## 2017-05-06 MED ORDER — ONDANSETRON HCL 4 MG/2ML IJ SOLN: 4.0000 mg | Freq: Four times a day (QID) | INTRAMUSCULAR | Status: DC | PRN

## 2017-05-06 MED ORDER — ASPIRIN EC 81 MG PO TBEC
81.0000 mg | DELAYED_RELEASE_TABLET | Freq: Every day | ORAL | Status: DC
  Administered 2017-05-06 – 2017-05-07 (×2): 81 mg via ORAL
  Filled 2017-05-06 (×2): qty 1

## 2017-05-06 MED ORDER — SODIUM CHLORIDE 0.9 % IV BOLUS (SEPSIS)
1000.0000 mL | Freq: Once | INTRAVENOUS | Status: AC
  Administered 2017-05-06: 1000 mL via INTRAVENOUS

## 2017-05-06 MED ORDER — INSULIN ASPART 100 UNIT/ML ~~LOC~~ SOLN
0.0000 [IU] | Freq: Three times a day (TID) | SUBCUTANEOUS | Status: DC
  Administered 2017-05-06: 3 [IU] via SUBCUTANEOUS
  Filled 2017-05-06: qty 1

## 2017-05-06 MED ORDER — ONDANSETRON HCL 4 MG PO TABS: 4.0000 mg | ORAL_TABLET | Freq: Four times a day (QID) | ORAL | Status: DC | PRN

## 2017-05-06 MED ORDER — POTASSIUM CHLORIDE CRYS ER 20 MEQ PO TBCR
40.0000 meq | EXTENDED_RELEASE_TABLET | Freq: Once | ORAL | Status: AC
  Administered 2017-05-06: 40 meq via ORAL
  Filled 2017-05-06: qty 2

## 2017-05-06 MED ORDER — SODIUM CHLORIDE 0.9 % IV SOLN
INTRAVENOUS | Status: DC
  Administered 2017-05-06: 03:00:00 via INTRAVENOUS

## 2017-05-06 MED ORDER — SODIUM CHLORIDE 0.9 % IV SOLN
INTRAVENOUS | Status: AC
  Administered 2017-05-06: 23:00:00 via INTRAVENOUS

## 2017-05-06 MED ORDER — INSULIN ASPART 100 UNIT/ML ~~LOC~~ SOLN: 0.0000 [IU] | Freq: Three times a day (TID) | SUBCUTANEOUS | Status: DC

## 2017-05-06 MED ORDER — INSULIN ASPART 100 UNIT/ML ~~LOC~~ SOLN: 0.0000 [IU] | Freq: Every day | SUBCUTANEOUS | Status: DC

## 2017-05-06 MED ORDER — INSULIN ASPART 100 UNIT/ML ~~LOC~~ SOLN
0.0000 [IU] | Freq: Three times a day (TID) | SUBCUTANEOUS | Status: DC
  Administered 2017-05-06: 15 [IU] via SUBCUTANEOUS
  Administered 2017-05-06: 3 [IU] via SUBCUTANEOUS
  Administered 2017-05-07: 2 [IU] via SUBCUTANEOUS
  Administered 2017-05-07: 5 [IU] via SUBCUTANEOUS
  Filled 2017-05-06: qty 1

## 2017-05-06 MED ORDER — PREGABALIN 75 MG PO CAPS
150.0000 mg | ORAL_CAPSULE | Freq: Two times a day (BID) | ORAL | Status: DC
  Administered 2017-05-06 – 2017-05-07 (×3): 150 mg via ORAL
  Filled 2017-05-06: qty 3
  Filled 2017-05-06 (×2): qty 2

## 2017-05-06 MED ORDER — INSULIN GLARGINE 100 UNIT/ML ~~LOC~~ SOLN
30.0000 [IU] | Freq: Every day | SUBCUTANEOUS | Status: DC
  Administered 2017-05-06: 30 [IU] via SUBCUTANEOUS
  Filled 2017-05-06 (×2): qty 0.3

## 2017-05-06 MED ORDER — ACETAMINOPHEN 325 MG PO TABS: 650.0000 mg | ORAL_TABLET | Freq: Four times a day (QID) | ORAL | Status: DC | PRN

## 2017-05-06 NOTE — ED Triage Notes (Signed)
Pt brought in from home via EMS after he was having cramping all over  Pt worked outside all day and drank about 7 bottles of water  Tonight he started having generalized body cramps  Pt was in the bath in warm water when EMS arrived  Initial B/P was 65/40  EMS started an IV and gave one liter of NS and transported to the hospital  B/P is now 133/73

## 2017-05-06 NOTE — Progress Notes (Signed)
Gillie MannersSteven M January is a 51 y.o. male with history of diabetes mellitus type 1 and hyperlipidemia started experiencing leg cramps and dizziness and weakness after he had mowing the yard yesterday. On arrival to ed he was found to be hypotensive, dehydrated and in acute renal failure.  Patient was admitted earlier this am, and see Dr Gypsy LoreKakrakandi's H&P in detail.   Plan: Continue with IV fluids and repeat renal parameters in am.    Kathlen Modyvijaya Eulia Hatcher, MD [phone]

## 2017-05-06 NOTE — ED Provider Notes (Signed)
By signing my name below, I, [name], attest that this documentation has been prepared under the direction and in the presence of Mckenize Mezera, Layla MawKristen N, DO. Electronically Signed: [name], Scribe. 05/06/2017. 1:23 AM.  TIME SEEN: 1:23 AM  CHIEF COMPLAINT: Cramps (generalized)  HPI:  HPI Comments: [name] is a 51 y.o. male with a PMHx of DM and HTN who presents to the Emergency Department complaining of constant moderate generalized cramps that started today. He started having dizziness while he was playing pool tonight. Felt like he might pass out. He reports he may have took the wrong dose of his HTN medication today. Thinks he took 10 mg instead of his 2.5 mg dose. He also admits to potential dehydration from working in his yard earlier today. EMS record a blood pressure of 65/40. Was given IV hydration with EMS and this improved. His blood pressure in the ED today was 94/59. He reports associated nausea. His cramps are currently improving overall. Denies any recent alcohol use, fever, chest pain, SOB, vomiting, diarrhea, blood in stool, melena, or other complaints at this time.   ROS: See HPI Constitutional: no fever  Eyes: no drainage  ENT: no runny nose   Cardiovascular:  no chest pain  Resp: no SOB  GI: no vomiting, no diarrhea, no blood in stool GU: no dysuria Integumentary: no rash  Allergy: no hives  Musculoskeletal: +cramps, no leg swelling  Neurological: no slurred speech ROS otherwise negative  PAST MEDICAL HISTORY/PAST SURGICAL HISTORY:  Past Medical History:  Diagnosis Date  . Diabetes mellitus without complication (HCC)   . Hepatitis   . Hypertension     MEDICATIONS:  Prior to Admission medications   Medication Sig Start Date End Date Taking? Authorizing Provider  ACCU-CHEK AVIVA PLUS test strip  10/02/16   [provider]  ACCU-CHEK FASTCLIX LANCETS MISC Use to check sugar four times daily 01/14/16   [provider]  aspirin 81 MG  tablet Take 81 mg by mouth daily.    [provider]  atorvastatin (LIPITOR) 40 MG tablet Take 40 mg by mouth daily.    [provider]  B-D UF III MINI PEN NEEDLES 31G X 5 MM MISC USE WITH INSULIN 4 TIMES A DAY 09/02/16   [provider]  glucose blood test strip Use to check sugar four times daily before each meal and at bedtime 01/14/16   [provider]  insulin glargine (LANTUS) 100 UNIT/ML injection Inject 16 Units into the skin at bedtime.    [provider]  insulin lispro (HUMALOG) 100 UNIT/ML injection Inject 6-12 Units into the skin 3 (three) times daily before meals. Sliding scale    [provider]  Insulin Syringe-Needle U-100 (BD INSULIN SYRINGE ULTRAFINE) 31G X 15/64" 1 ML MISC USE TO INJECT HUMALOG THREE TIMES DAILY BEFORE MEALS 01/18/16   [provider]  lisinopril (PRINIVIL,ZESTRIL) 20 MG tablet Take 10 mg by mouth daily.    [provider]  pregabalin (LYRICA) 150 MG capsule Take 150 mg by mouth 2 (two) times daily.    [provider]  TOUJEO SOLOSTAR 300 UNIT/ML [facility]  09/02/16   [provider]    ALLERGIES:  Allergies  Allergen Reactions  . Other Other (See Comments)    SOCIAL HISTORY:  Social History  Substance Use Topics  . Smoking status: Never Smoker  . Smokeless tobacco: Current User    Types: Chew     Comment: chewing tobacco  . Alcohol  use Yes     Comment: 6 pack daily    FAMILY HISTORY: Family History  Problem Relation Age of Onset  . Hyperlipidemia Mother   . Hyperlipidemia Brother     EXAM: BP (!) 94/59 (BP Location: Right Arm)   Pulse 88   Temp 97.7 F (36.5 C) (Oral)   Resp 19   SpO2 94%  CONSTITUTIONAL: Alert and oriented and responds appropriately to questions. Well-appearing; well-nourished HEAD: Normocephalic EYES: Conjunctivae clear, pupils appear equal, EOMI ENT: normal nose; moist mucous membranes NECK: Supple, no meningismus, no nuchal  rigidity, no LAD  CARD: RRR; S1 and S2 appreciated; no murmurs, no clicks, no rubs, no gallops RESP: Normal chest excursion without splinting or tachypnea; breath sounds clear and equal bilaterally; no wheezes, no rhonchi, no rales, no hypoxia or respiratory distress, speaking full sentences ABD/GI: Normal bowel sounds; non-distended; soft, non-tender, no rebound, no guarding, no peritoneal signs, no hepatosplenomegaly BACK:  The back appears normal and is non-tender to palpation, there is no CVA tenderness EXT: Normal ROM in all joints; non-tender to palpation; no edema; normal capillary refill; no cyanosis, no calf tenderness or swelling    SKIN: Normal color for age and race; warm; no rash NEURO: Moves all extremities equally PSYCH: The patient's mood and manner are appropriate. Grooming and personal hygiene are appropriate.  MEDICAL DECISION MAKING: Patient here with hypotension. He has been outside in the heat and is having muscle cramps. Reports he did drink several bottles of water. No infectious symptoms. Blood pressures improved with EMS with IV hydration. States his cramps are gone. No bloody stools or melena. History of hypertension and he thinks he may have actually taken a 10 mg tablet of lisinopril instead of 2.5 mg tablet.  We'll check labs, electrolytes and give IV hydration. Doubt sepsis.  ED PROGRESS: Labs show mild leukocytosis but again no infectious symptoms. This is likely reactive. Urine shows no sign of infection. He is not having cough, headache, neck pain or neck stiffness, sore throat, abdominal pain. No vomiting or diarrhea. No rash or cellulitis. Patient does appear to have acute renal failure today likely from dehydration. Potassium slightly low at 3.3 which I will give him oral replacement 4. Magnesium is normal. We'll continue to hydrate patient. Blood pressure is improving slowly. Will discuss with hospitalist for admission. [facility]CP [facility].   2:40 AM Discussed patient's case with hospitalist, Dr. Toniann Fail.  I have recommended admission and patient (and family if present) agree with this plan. Admitting physician will place admission orders.   I reviewed all nursing notes, vitals, pertinent previous records, EKGs, lab and urine results, imaging (as available).    Date: 05/06/2017  00:54  Rate: 87  Rhythm: normal sinus rhythm  QRS Axis: normal  Intervals: normal  ST/T Wave abnormalities: normal  Conduction Disutrbances: none  Narrative Interpretation: Q waves in inferior leads      CRITICAL CARE Performed by: Raelyn Number   Total critical care time: 45 minutes  Critical care time was exclusive of separately billable procedures and treating other patients.  Critical care was necessary to treat or prevent imminent or life-threatening deterioration.  Critical care was time spent personally by me on the following activities: development of treatment plan with patient and/or surrogate as well as nursing, discussions with consultants, evaluation of patient's response to treatment, examination of patient, obtaining history from patient or surrogate, ordering and performing treatments and interventions, ordering and review of laboratory studies,  ordering and review of radiographic studies, pulse oximetry and re-evaluation of patient's condition.   I personally performed the services described in this documentation, which was scribed in my presence. The recorded information has been reviewed and is accurate.     Eriq Hufford, Layla Maw, DO 05/06/17 [phone]

## 2017-05-06 NOTE — ED Notes (Signed)
ED Provider at bedside. 

## 2017-05-06 NOTE — H&P (Addendum)
History and Physical    [name]:[id number] DOB: 1966-04-17 DOA: 05/06/2017  PCP: Kaleen Mask, MD  Patient coming from: Home.  Chief Complaint: Leg cramps and weakness.  HPI: [name] is a 51 y.o. male with history of diabetes mellitus type 1 and hyperlipidemia started experiencing leg cramps and dizziness and weakness after he had mowing the yard yesterday. Since his weakness and cramps worsened he called the EMS. Patient was found to be hypotensive at the site. With blood pressure in the 90s systolic. EMS gave 1 L bolus. Patient was brought to the ER. Patient denies any nausea vomiting diarrhea chest pain or shortness of breath.  ED Course: In the ER patient was found to have a creatinine of 2.7 with elevated CK levels. Patient was given a total of 3 L bolus and started on normal saline infusion for acute renal failure with rhabdomyolysis.  Review of Systems: As per HPI, rest all negative.   Past Medical History:  Diagnosis Date  . Diabetes mellitus without complication (HCC)   . Hepatitis   . Hypertension     Past Surgical History:  Procedure Laterality Date  . BACK SURGERY     cervical  . CARDIAC CATHETERIZATION    . FOOT SURGERY Left   . LEG AMPUTATION Right    BKA     reports that he has never smoked. His smokeless tobacco use includes Chew. He reports that he drinks alcohol. He reports that he does not use drugs.  Allergies  Allergen Reactions  . Other Other (See Comments)    Family History  Problem Relation Age of Onset  . Hyperlipidemia Mother   . Hyperlipidemia Brother     Prior to Admission medications   Medication Sig Start Date End Date Taking? Authorizing Provider  aspirin 81 MG tablet Take 81 mg by mouth daily.   Yes [provider]  atorvastatin (LIPITOR) 40 MG tablet Take 40 mg by mouth daily.   Yes [provider]  insulin lispro (HUMALOG) 100 UNIT/ML injection Inject 6-12 Units into the skin 3 (three)  times daily before meals. Sliding scale   Yes [provider]  lisinopril (PRINIVIL,ZESTRIL) 2.5 MG tablet Take 2.5 mg by mouth daily.   Yes [provider]  OVER THE COUNTER MEDICATION Take 1 tablet by mouth as needed (leg cramps).   Yes [provider]  pregabalin (LYRICA) 150 MG capsule Take 150 mg by mouth 2 (two) times daily.   Yes [provider]  TOUJEO SOLOSTAR 300 UNIT/ML SOPN Inject 30 Units into the skin at bedtime.  09/02/16  Yes [provider]  ACCU-CHEK AVIVA PLUS test strip  10/02/16   [provider]  ACCU-CHEK FASTCLIX LANCETS MISC Use to check sugar four times daily 01/14/16   [provider]  B-D UF III MINI PEN NEEDLES 31G X 5 MM MISC USE WITH INSULIN 4 TIMES A DAY 09/02/16   [provider]  glucose blood test strip Use to check sugar four times daily before each meal and at bedtime 01/14/16   [provider]  Insulin Syringe-Needle U-100 (BD INSULIN SYRINGE ULTRAFINE) 31G X 15/64" 1 ML MISC USE TO INJECT HUMALOG THREE TIMES DAILY BEFORE MEALS 01/18/16   [provider]    Physical Exam: Vitals:   05/06/17 0049 05/06/17 0055 05/06/17 0240  BP:  (!) 94/59 103/62  Pulse:  88 90  Resp:  19 18  Temp:  97.7 F (36.5 C)  TempSrc:  Oral   SpO2: 98% 94% 98%      Constitutional: Moderately built and nourished. Vitals:   05/06/17 0049 05/06/17 0055 05/06/17 0240  BP:  (!) 94/59 103/62  Pulse:  88 90  Resp:  19 18  Temp:  97.7 F (36.5 C)   TempSrc:  Oral   SpO2: 98% 94% 98%   Eyes: Anicteric no pallor. ENMT: No discharge from the ears eyes nose and mouth. Neck: No mass felt. No JVD appreciated. Respiratory: No rhonchi or crepitations. Cardiovascular: S1-S2 heard. No murmurs appreciated. Abdomen: Soft nontender bowel sounds present. Musculoskeletal: No edema. No joint effusion. Skin: No rash. Skin appears warm. Neurologic: Alert awake oriented to time place and person. Moves  all extremities. Psychiatric: Appears normal. Normal affect.   Labs on Admission: I have personally reviewed following labs and imaging studies  CBC:  Recent Labs Lab 05/06/17 0133  WBC 12.7*  NEUTROABS 10.8*  HGB 14.8  HCT 40.8  MCV 85.4  PLT 309   Basic Metabolic Panel:  Recent Labs Lab 05/06/17 0133  NA 138  K 3.3*  CL 102  CO2 24  GLUCOSE 175*  BUN 21*  CREATININE 2.67*  CALCIUM 9.3  MG 2.2   GFR: CrCl cannot be calculated (Unknown ideal weight.). Liver Function Tests:  Recent Labs Lab 05/06/17 0133  AST 32  ALT 26  ALKPHOS 71  BILITOT 1.1  PROT 6.9  ALBUMIN 4.2   No results for input(s): LIPASE, AMYLASE in the last 168 hours. No results for input(s): AMMONIA in the last 168 hours. Coagulation Profile: No results for input(s): INR, PROTIME in the last 168 hours. Cardiac Enzymes:  Recent Labs Lab 05/06/17 0133  CKTOTAL 858*   BNP (last 3 results) No results for input(s): PROBNP in the last 8760 hours. HbA1C: No results for input(s): HGBA1C in the last 72 hours. CBG:  Recent Labs Lab 05/06/17 0053  GLUCAP 169*   Lipid Profile: No results for input(s): CHOL, HDL, LDLCALC, TRIG, CHOLHDL, LDLDIRECT in the last 72 hours. Thyroid Function Tests: No results for input(s): TSH, T4TOTAL, FREET4, T3FREE, THYROIDAB in the last 72 hours. Anemia Panel: No results for input(s): VITAMINB12, FOLATE, FERRITIN, TIBC, IRON, RETICCTPCT in the last 72 hours. Urine analysis:    Component Value Date/Time   COLORURINE YELLOW 05/08/2013 2258   APPEARANCEUR CLEAR 05/08/2013 2258   LABSPEC 1.011 05/08/2013 2258   PHURINE 6.0 05/08/2013 2258   GLUCOSEU 500 (A) 05/08/2013 2258   HGBUR NEGATIVE 05/08/2013 2258   BILIRUBINUR NEGATIVE 05/08/2013 2258   KETONESUR 40 (A) 05/08/2013 2258   PROTEINUR NEGATIVE 05/08/2013 2258   UROBILINOGEN 1.0 05/08/2013 2258   NITRITE NEGATIVE 05/08/2013 2258   LEUKOCYTESUR NEGATIVE 05/08/2013 2258   Sepsis  Labs: @LABRCNTIP (procalcitonin:4,lacticidven:4) )No results found for this or any previous visit (from the past 240 hour(s)).   Radiological Exams on Admission: No results found.   Assessment/Plan Principal Problem:   ARF (acute renal failure) (HCC) Active Problems:   DM type 1 (diabetes mellitus, type 1) (HCC)   Peripheral vascular disease, unspecified (HCC)   Rhabdomyolysis   1. Acute renal failure with rhabdomyolysis - likely due to dehydration. Continue with aggressive hydration. Hold statins due to rhabdomyolysis. Hold lisinopril due to acute renal failure. Follow metabolic panel and CK levels. 2. Diabetes mellitus type 1 - continue long-acting insulin with sliding scale coverage. If creatinine worsens then insulin dose may need to be decreased.  I have reviewed patient's old chart some labs.   DVT  prophylaxis:  SCDs. Code Status:  Full code.  Family Communication:  Discussed with patient.  Disposition Plan:  Home.  Consults called:  None.  Admission status:  Observation.    Eduard ClosKAKRAKANDY,Kashon Kraynak N. MD [facility] Pager [phone].  If 7PM-7AM, please contact night-coverage [email or web address] Password [email or web address]  05/06/2017, 3:38 AM

## 2017-05-06 NOTE — ED Notes (Signed)
Pt is aware that urine is needed for sample.  Urinal at bedside.

## 2017-05-07 DIAGNOSIS — E108 Type 1 diabetes mellitus with unspecified complications: Secondary | ICD-10-CM | POA: Diagnosis not present

## 2017-05-07 DIAGNOSIS — E860 Dehydration: Secondary | ICD-10-CM | POA: Diagnosis not present

## 2017-05-07 DIAGNOSIS — N179 Acute kidney failure, unspecified: Secondary | ICD-10-CM | POA: Diagnosis not present

## 2017-05-07 LAB — BASIC METABOLIC PANEL
Anion gap: 5 (ref 5–15)
BUN: 11 mg/dL (ref 6–20)
CHLORIDE: 107 mmol/L (ref 101–111)
CO2: 27 mmol/L (ref 22–32)
CREATININE: 0.64 mg/dL (ref 0.61–1.24)
Calcium: 8 mg/dL — ABNORMAL LOW (ref 8.9–10.3)
GFR calc non Af Amer: >60 mL/min (ref >60)
Glucose, Bld: 158 mg/dL — ABNORMAL HIGH (ref 65–99)
POTASSIUM: 4 mmol/L (ref 3.5–5.1)
Sodium: 139 mmol/L (ref 135–145)

## 2017-05-07 LAB — GLUCOSE, CAPILLARY
GLUCOSE-CAPILLARY: 123 mg/dL — AB (ref 65–99)
GLUCOSE-CAPILLARY: 144 mg/dL — AB (ref 65–99)
Glucose-Capillary: 219 mg/dL — ABNORMAL HIGH (ref 65–99)

## 2017-05-07 NOTE — Progress Notes (Signed)
Date: May 07, 2017 Chart reviewed for discharge orders: None found for case management. Marquell Saenz,BSN,RN3, CCM/[phone] 

## 2017-05-07 NOTE — Discharge Summary (Signed)
Physician Discharge Summary  [name]:[id number] DOB: [birth year] DOA: 05/06/2017  PCP: Kaleen Mask, MD  Admit date: 05/06/2017 Discharge date: 05/07/2017  Admitted From: Home.  Disposition: Home  Recommendations for Outpatient Follow-up:  1. Follow up with PCP in 1-2 weeks 2. Please obtain BMP/CBC/ CK levels in one week 3. Please follow up with PCP regarding lisinopril, which we held in view of your renal failure.     Discharge Condition:stable.  CODE STATUS: full code.  Diet recommendation: Heart Healthy   Brief/Interim Summary: [name] a 51 y.o.malewith history of diabetes mellitus type 1 and hyperlipidemia started experiencing leg cramps and dizziness and weakness after he had mowingthe yard yesterday. On arrival to ed he was found to be hypotensive, dehydrated and in acute renal failure.  Discharge Diagnoses:  Principal Problem:   ARF (acute renal failure) (HCC) Active Problems:   DM type 1 (diabetes mellitus, type 1) (HCC)   Peripheral vascular disease, unspecified (HCC)   Rhabdomyolysis  Acute renal failure with rhabdomyolysis:  Suspect from dehydration.  Improved with IV fluids.  Lisinopril held in view of the renal failure.  Creatinine back to baseline,  Pt asymptomatic on discharge.    Type 1 DM:  CBG (last 3)   Recent Labs  05/07/17 0634 05/07/17 0706 05/07/17 1204  GLUCAP 144* 123* 219*    Resume home meds on discharge.   H/o PVD:  Resume aspirin and statin on discharge.    Discharge Instructions  Discharge Instructions    Diet - low sodium heart healthy    Complete by:  As directed    Discharge instructions    Complete by:  As directed    We have held your lisinopril because of your renal failure and dehydration and hypotension. Please follow up with your PCP in one week and resume lisinopril if your renal parameters improve.     Allergies as of 05/07/2017      Reactions   Other Other (See Comments)       Medication List    STOP taking these medications   lisinopril 2.5 MG tablet Commonly known as:  PRINIVIL,ZESTRIL     TAKE these medications   ACCU-CHEK FASTCLIX LANCETS Misc Use to check sugar four times daily   aspirin 81 MG tablet Take 81 mg by mouth daily.   atorvastatin 40 MG tablet Commonly known as:  LIPITOR Take 40 mg by mouth daily.   B-D UF III MINI PEN NEEDLES 31G X 5 MM Misc Generic drug:  Insulin Pen Needle USE WITH INSULIN 4 TIMES A DAY   BD INSULIN SYRINGE ULTRAFINE 31G X 15/64" 1 ML Misc Generic drug:  Insulin Syringe-Needle U-100 USE TO INJECT HUMALOG THREE TIMES DAILY BEFORE MEALS   glucose blood test strip Use to check sugar four times daily before each meal and at bedtime   ACCU-CHEK AVIVA PLUS test strip Generic drug:  glucose blood   insulin lispro 100 UNIT/ML injection Commonly known as:  HUMALOG Inject 6-12 Units into the skin 3 (three) times daily before meals. Sliding scale   OVER THE COUNTER MEDICATION Take 1 tablet by mouth as needed (leg cramps).   pregabalin 150 MG capsule Commonly known as:  LYRICA Take 150 mg by mouth 2 (two) times daily.   TOUJEO SOLOSTAR 300 UNIT/ML Sopn Generic drug:  Insulin Glargine Inject 30 Units into the skin at bedtime.      Follow-up Information    Kaleen Mask, MD. Schedule an appointment as  soon as possible for a visit in 1 week(s).   Specialty:  Family Medicine Why:  follow up post hospitalization visit . recheck bmp ino ne week,  Contact information: [address] [place] [address] [phone]          Allergies  Allergen Reactions  . Other Other (See Comments)    Consultations:  None.    Procedures/Studies:  No results found.    Subjective: No new complaints. No myalgias, no nausea or vomiting.   Discharge Exam: Vitals:   05/06/17 1900 05/07/17 0503  BP: (!) 103/59 114/78  Pulse: 97 79  Resp: 16 16  Temp: 98.4 F (36.9 C) 98.4 F (36.9 C)   Vitals:    05/06/17 1415 05/06/17 1457 05/06/17 1900 05/07/17 0503  BP: 107/76 (!) 99/55 (!) 103/59 114/78  Pulse: 98 91 97 79  Resp: 17 18 16 16   Temp:  98.5 F (36.9 C) 98.4 F (36.9 C) 98.4 F (36.9 C)  TempSrc:  Oral Oral Oral  SpO2: 94% 97% 97% 99%  Weight:    73 kg (160 lb 15 oz)    General: Pt is alert, awake, not in acute distress Cardiovascular: RRR, S1/S2 +, no rubs, no gallops Respiratory: CTA bilaterally, no wheezing, no rhonchi Abdominal: Soft, NT, ND, bowel sounds + Extremities: no edema, no cyanosis    The results of significant diagnostics from this hospitalization (including imaging, microbiology, ancillary and laboratory) are listed below for reference.     Microbiology: Recent Results (from the past 240 hour(s))  MRSA PCR Screening     Status: None   Collection Time: 05/06/17  7:01 PM  Result Value Ref Range Status   MRSA by PCR NEGATIVE NEGATIVE Final    Comment:        The GeneXpert MRSA Assay (FDA approved for NASAL specimens only), is one component of a comprehensive MRSA colonization surveillance program. It is not intended to diagnose MRSA infection nor to guide or monitor treatment for MRSA infections.      Labs: BNP (last 3 results) No results for input(s): BNP in the last 8760 hours. Basic Metabolic Panel:  Recent Labs Lab 05/06/17 0133 05/06/17 0458 05/07/17 0624  NA 138 139 139  K 3.3* 3.8 4.0  CL 102 106 107  CO2 24 22 27   GLUCOSE 175* 236* 158*  BUN 21* 21* 11  CREATININE 2.67* 1.82* 0.64  CALCIUM 9.3 8.4* 8.0*  MG 2.2  --   --    Liver Function Tests:  Recent Labs Lab 05/06/17 0133 05/06/17 0458  AST 32 38  ALT 26 27  ALKPHOS 71 63  BILITOT 1.1 0.8  PROT 6.9 5.7*  ALBUMIN 4.2 3.5   No results for input(s): LIPASE, AMYLASE in the last 168 hours. No results for input(s): AMMONIA in the last 168 hours. CBC:  Recent Labs Lab 05/06/17 0133 05/06/17 0458  WBC 12.7* 12.4*  NEUTROABS 10.8*  --   HGB 14.8 13.1  HCT  40.8 37.5*  MCV 85.4 85.2  PLT 309 302   Cardiac Enzymes:  Recent Labs Lab 05/06/17 0133 05/06/17 0458 05/06/17 0806  CKTOTAL 858* 1,794*  --   TROPONINI  --   --  <0.03   BNP: Invalid input(s): POCBNP CBG:  Recent Labs Lab 05/06/17 1157 05/06/17 1817 05/06/17 2211 05/07/17 0634 05/07/17 0706  GLUCAP 373* 174* 164* 144* 123*   D-Dimer No results for input(s): DDIMER in the last 72 hours. Hgb A1c No results for input(s): HGBA1C in  the last 72 hours. Lipid Profile No results for input(s): CHOL, HDL, LDLCALC, TRIG, CHOLHDL, LDLDIRECT in the last 72 hours. Thyroid function studies  Recent Labs  05/06/17 0458  TSH 1.285   Anemia work up No results for input(s): VITAMINB12, FOLATE, FERRITIN, TIBC, IRON, RETICCTPCT in the last 72 hours. Urinalysis    Component Value Date/Time   COLORURINE YELLOW 05/06/2017 0355   APPEARANCEUR HAZY (A) 05/06/2017 0355   LABSPEC 1.025 05/06/2017 0355   PHURINE 5.0 05/06/2017 0355   GLUCOSEU >=500 (A) 05/06/2017 0355   HGBUR SMALL (A) 05/06/2017 0355   BILIRUBINUR NEGATIVE 05/06/2017 0355   KETONESUR 5 (A) 05/06/2017 0355   PROTEINUR 30 (A) 05/06/2017 0355   UROBILINOGEN 1.0 05/08/2013 2258   NITRITE NEGATIVE 05/06/2017 0355   LEUKOCYTESUR NEGATIVE 05/06/2017 0355   Sepsis Labs Invalid input(s): PROCALCITONIN,  WBC,  LACTICIDVEN Microbiology Recent Results (from the past 240 hour(s))  MRSA PCR Screening     Status: None   Collection Time: 05/06/17  7:01 PM  Result Value Ref Range Status   MRSA by PCR NEGATIVE NEGATIVE Final    Comment:        The GeneXpert MRSA Assay (FDA approved for NASAL specimens only), is one component of a comprehensive MRSA colonization surveillance program. It is not intended to diagnose MRSA infection nor to guide or monitor treatment for MRSA infections.      Time coordinating discharge: Over 30 minutes  SIGNED:   Kathlen ModyAKULA,Draiden Mirsky, MD  [facility] 05/07/2017, 11:23 AM Pager    If 7PM-7AM, please contact night-coverage [email or web address] Password [email or web address]

## 2017-05-07 NOTE — Care Management Note (Signed)
Case Management Note  Patient Details  Name: Gillie MannersSteven M Moragne MRN: [id number] Date of Birth: Apr 02, 1966  Subjective/Objective:                  50 y.o.malewith history of diabetes mellitus type 1 and hyperlipidemia started experiencing leg cramps and dizziness and weakness after he had mowingthe yard yesterday. On arrival to ed he was found to be hypotensive, dehydrated and in acute renal failure.   Action/Plan: Date:  May 07, 2017  Chart reviewed for concurrent status and case management needs.  Will continue to follow patient progress.  Discharge Planning: following for needs  Expected discharge date: [id number]  Marcelle SmilingRhonda Garnell Begeman, BSN, [place] WestRN3, [place]CCM   [phone]   Expected Discharge Date:   (unknown)               Expected Discharge Plan:  Home/Self Care  In-House Referral:     Discharge planning Services  CM Consult  Post Acute Care Choice:    Choice offered to:     DME Arranged:    DME Agency:     HH Arranged:    HH Agency:     Status of Service:  In process, will continue to follow  If discussed at Long Length of Stay Meetings, dates discussed:    Additional Comments:  Golda AcreDavis, Annye Forrey Lynn, RN 05/07/2017, 9:11 AM

## 2018-02-28 DEATH — deceased
